# Patient Record
Sex: Female | Born: 1980 | Race: White | Hispanic: Yes | Marital: Single | Smoking: Never smoker
Health system: Southern US, Community
[De-identification: ages and names within clinical notes are randomized; demographics above are authoritative.]

## PROBLEM LIST (undated history)

## (undated) DIAGNOSIS — I10 Essential (primary) hypertension: Secondary | ICD-10-CM

## (undated) HISTORY — PX: OTHER SURGICAL HISTORY: SHX169

## (undated) HISTORY — DX: Essential (primary) hypertension: I10

---

## 2002-08-05 ENCOUNTER — Emergency Department (HOSPITAL_COMMUNITY): Admission: EM | Admit: 2002-08-05 | Discharge: 2002-08-05 | Payer: Self-pay | Admitting: Emergency Medicine

## 2002-08-07 ENCOUNTER — Observation Stay (HOSPITAL_COMMUNITY): Admission: AD | Admit: 2002-08-07 | Discharge: 2002-08-08 | Payer: Self-pay | Admitting: *Deleted

## 2002-08-10 ENCOUNTER — Encounter: Payer: Self-pay | Admitting: Emergency Medicine

## 2002-08-10 ENCOUNTER — Emergency Department (HOSPITAL_COMMUNITY): Admission: EM | Admit: 2002-08-10 | Discharge: 2002-08-10 | Payer: Self-pay | Admitting: Emergency Medicine

## 2002-08-14 ENCOUNTER — Ambulatory Visit (HOSPITAL_COMMUNITY): Admission: RE | Admit: 2002-08-14 | Discharge: 2002-08-14 | Payer: Self-pay | Admitting: *Deleted

## 2003-06-29 ENCOUNTER — Emergency Department (HOSPITAL_COMMUNITY): Admission: EM | Admit: 2003-06-29 | Discharge: 2003-06-30 | Payer: Self-pay | Admitting: Emergency Medicine

## 2003-07-05 ENCOUNTER — Emergency Department (HOSPITAL_COMMUNITY): Admission: EM | Admit: 2003-07-05 | Discharge: 2003-07-06 | Payer: Self-pay | Admitting: Emergency Medicine

## 2003-09-14 ENCOUNTER — Ambulatory Visit (HOSPITAL_COMMUNITY): Admission: RE | Admit: 2003-09-14 | Discharge: 2003-09-14 | Payer: Self-pay | Admitting: *Deleted

## 2003-12-02 ENCOUNTER — Ambulatory Visit (HOSPITAL_COMMUNITY): Admission: RE | Admit: 2003-12-02 | Discharge: 2003-12-02 | Payer: Self-pay | Admitting: *Deleted

## 2004-01-06 ENCOUNTER — Ambulatory Visit (HOSPITAL_COMMUNITY): Admission: AD | Admit: 2004-01-06 | Discharge: 2004-01-07 | Payer: Self-pay | Admitting: *Deleted

## 2004-01-25 ENCOUNTER — Inpatient Hospital Stay (HOSPITAL_COMMUNITY): Admission: AD | Admit: 2004-01-25 | Discharge: 2004-01-28 | Payer: Self-pay | Admitting: *Deleted

## 2004-02-10 ENCOUNTER — Inpatient Hospital Stay (HOSPITAL_COMMUNITY): Admission: AD | Admit: 2004-02-10 | Discharge: 2004-02-13 | Payer: Self-pay | Admitting: Emergency Medicine

## 2007-08-17 ENCOUNTER — Emergency Department (HOSPITAL_COMMUNITY): Admission: EM | Admit: 2007-08-17 | Discharge: 2007-08-17 | Payer: Self-pay | Admitting: Emergency Medicine

## 2009-12-19 ENCOUNTER — Ambulatory Visit (HOSPITAL_COMMUNITY): Admission: RE | Admit: 2009-12-19 | Discharge: 2009-12-19 | Payer: Self-pay | Admitting: Family Medicine

## 2010-08-02 ENCOUNTER — Emergency Department (HOSPITAL_COMMUNITY)
Admission: EM | Admit: 2010-08-02 | Discharge: 2010-08-03 | Disposition: A | Discharge status: Home / Self Care | Payer: Self-pay | Attending: Emergency Medicine | Admitting: Emergency Medicine

## 2010-08-02 ENCOUNTER — Emergency Department (HOSPITAL_COMMUNITY): Payer: Self-pay

## 2010-08-02 DIAGNOSIS — R0602 Shortness of breath: Secondary | ICD-10-CM | POA: Insufficient documentation

## 2010-08-02 DIAGNOSIS — K219 Gastro-esophageal reflux disease without esophagitis: Secondary | ICD-10-CM | POA: Insufficient documentation

## 2010-08-02 DIAGNOSIS — R071 Chest pain on breathing: Secondary | ICD-10-CM | POA: Insufficient documentation

## 2010-08-02 DIAGNOSIS — R0609 Other forms of dyspnea: Secondary | ICD-10-CM | POA: Insufficient documentation

## 2010-08-02 DIAGNOSIS — R0989 Other specified symptoms and signs involving the circulatory and respiratory systems: Secondary | ICD-10-CM | POA: Insufficient documentation

## 2010-08-02 LAB — URINALYSIS, ROUTINE W REFLEX MICROSCOPIC
Hgb urine dipstick: NEGATIVE
Nitrite: NEGATIVE
Protein, ur: NEGATIVE mg/dL
Specific Gravity, Urine: 1.01 (ref 1.005–1.030)
Urobilinogen, UA: 0.2 mg/dL (ref 0.0–1.0)

## 2010-08-02 LAB — DIFFERENTIAL
Eosinophils Relative: 1 % (ref 0–5)
Lymphocytes Relative: 38 % (ref 12–46)
Monocytes Absolute: 0.5 10*3/uL (ref 0.1–1.0)
Monocytes Relative: 7 % (ref 3–12)
Neutro Abs: 4 10*3/uL (ref 1.7–7.7)

## 2010-08-02 LAB — POCT CARDIAC MARKERS: Myoglobin, poc: 76 ng/mL (ref 12–200)

## 2010-08-02 LAB — BASIC METABOLIC PANEL
CO2: 25 mEq/L (ref 19–32)
Calcium: 9.6 mg/dL (ref 8.4–10.5)
Chloride: 104 mEq/L (ref 96–112)
GFR calc Af Amer: >60 mL/min (ref >60)
Glucose, Bld: 109 mg/dL — ABNORMAL HIGH (ref 70–99)
Sodium: 135 mEq/L (ref 135–145)

## 2010-08-02 LAB — CBC
HCT: 41.5 % (ref 36.0–46.0)
Hemoglobin: 15 g/dL (ref 12.0–15.0)
MCH: 30.9 pg (ref 26.0–34.0)
MCHC: 36.1 g/dL — ABNORMAL HIGH (ref 30.0–36.0)
MCV: 85.4 fL (ref 78.0–100.0)
RDW: 11.7 % (ref 11.5–15.5)

## 2010-08-03 LAB — D-DIMER, QUANTITATIVE

## 2010-10-13 NOTE — H&P (Signed)
NAME:  Tina Shepard, Tina [name]:  [id number]   MEDICAL RECORD NO.:  [id number]                   PATIENT TYPE:   LOCATION:  LDR1                                 FACILITY:   PHYSICIAN:  Langley Gauss, M.D.                DATE OF BIRTH:   DATE OF ADMISSION:  01/25/2004  DATE OF DISCHARGE:                                HISTORY & PHYSICAL   HISTORY OF PRESENT ILLNESS:  The patient is a 30 year old gravida 3, para 2  at 38-1/[redacted] weeks gestation who is admitted for medically-indicated induction  of labor. The patient is noted to have a history of very-rapid labor and  delivery with two previous.  In addition, she is noted to have a very short  second stage with only three pushes with previous delivery.  The patient's  prenatal course has been uncomplicated.  She is noted to have glucose  tolerance test normal at 80, O-positive blood type.  She did have some first  trimester bleeding and was seen in the emergency room on two occasions at  which time fetal viability had been confirmed.  She has had serial  ultrasound which have documented normal anatomic surgery and adequate fetal  growth.  She is GBS carrier status negative, tested January 05, 2004.   PAST MEDICAL HISTORY:  No known drug allergies.   CURRENT MEDICATIONS:  Prenatal vitamins only.   PAST OB HISTORY:  December of 1998, vaginal delivery of a 5-pound infant,  and April of 2000, vaginal delivery of a 6-pound infant.  On both of these  occasions, she had declined placement of epidural and had received IV pain  medication only.  As stated previously, she was noted to have very-short  second stages of labor.   SOCIAL HISTORY:  The patient is a nonsmoker.  Family expected to be very  involved with the labor process at labor and delivery.  The patient does  plan on bottle feeding.   She would like to utilize an IUD for postpartum birth control purposes.  She  had previously used a NuvaRing.   PHYSICAL  EXAMINATION:  VITAL SIGNS:  She is 5 feet, 1 inch.  Pre-pregnancy  weight 117.  Today's weight 155.  Blood pressure 126/78.  Pulse rate 80.  Respiratory rate 20.  HEENT:  Negative.  No adenopathy.  NECK:  Supple.  Thyroid is nonpalpable.  LUNGS:  Clear.  CARDIOVASCULAR:  Regular rate and rhythm.  ABDOMEN:  Soft, nontender.  No surgical scars are identified.  She is vertex  presentation by Thayer Ohm maneuver.  Fundal height is 34 cm.  Fetal heart  tones auscultated in the 150's.  PELVIC:  Pelvic exam, normal external genitalia.  No lesions or ulcerations  identified.  Difficult digital examination.  The patient tenses up.  She,  however, is noted to have vertex at 0 station.  Posterior position of the  cervix, dilated about 1 cm, very soft.  ASSESSMENT:  Term intrauterine pregnancy, with very rapid labor and delivery  previously.  Due to the slightly unfavorable nature of the cervix, the  patient will be referred in the a.m. for placement of _________ for  mechanical ripening of cervix prior to amniotomy.     ___________________________________________                                         Langley Gauss, M.D.   DC/MEDQ  D:  01/19/2004  T:  01/19/2004  Job:  [id number]

## 2010-10-13 NOTE — Discharge Summary (Signed)
   [name], BATCH                       ACCOUNT NO.:  [id number]   MEDICAL RECORD NO.:  [id number]                   PATIENT TYPE:  OBV   LOCATION:  A419                                 FACILITY:  [facility]   PHYSICIAN:  Langley Gauss, M.D.                DATE OF BIRTH:  26-Oct-1980   DATE OF ADMISSION:  08/07/2002  DATE OF DISCHARGE:  08/08/2002                                 DISCHARGE SUMMARY   DISCHARGE DIAGNOSES:  1. Endometritis.  2. Nausea and vomiting secondary to No. 1.  3. Status post removal of intrauterine device 4 days previously.   DISPOSITION:  At time of discharge, the patient is tolerating regular  general diet.  She will follow up in the office in one week's time.   LABORATORY DATA:  HCG is negative.  Hemoglobin 11.2, hematocrit 33.0 with a  white count of 6.1.  Electrolytes within normal limits.  Liver function  tests within normal limits.   DISCHARGE MEDICATIONS:  The patient has taken her Ortho-Evra patch off  during this hospitalization, but she will reinitiate it use for birth  control purposes.  She did receive Rocephin 1 gm IV q.24 hours during this  hospitalization, receiving 2 dosages.   HOSPITAL COURSE:  The patient presented to the office on August 07, 2002,  complaining of significant nausea and vomiting, stating she has been unable  to keep anything down, with resultant dehydration.  Upon examination, she  was noted to have somewhat of a purulent-looking discharge from the cervical  os and manual examination revealed a normal size uterus with no adnexal  masses, but the uterus was noted to be acutely tender to deep manipulation.  Thus diagnosis was that of endometritis, status post IUD removal.  The  patient was referred to [facility] at which time she was treated  with IV fluids.  In addition, IV antibiotics were initiated and continued  x24 hours.  The patient did well with the rehydration therapy such that she  was able to tolerated  regular general diet, markedly improved.  She did  remain afebrile during the hospital course, with stable vital signs.  Discharged to home thus on August 08, 2002, and follow up in one week's time.  Hopefully, the patient will do well with the Lucienne Minks for birth control  purposes; however if she continues to have nausea and headache, which could  possibly be due to the [facility], the patient has expressed an interest in  Nuva ring intravaginal insert.   SUMMARY:                                               Langley Gauss, M.D.    DC/MEDQ  D:  08/10/2002  T:  08/10/2002  Job:  [id number]

## 2010-10-13 NOTE — Group Therapy Note (Signed)
[name], [name] NO.:  [id number]   MEDICAL RECORD NO.:  [id number]                   PATIENT TYPE:   LOCATION:                                       FACILITY:   PHYSICIAN:  Langley Gauss, M.D.                DATE OF BIRTH:   DATE OF PROCEDURE:  01/26/2004  DATE OF DISCHARGE:                                   PROGRESS NOTE   The patient had the Foley bulb placed the p.m. of 01/25/2004 for mechanical  ripening of the cervix. Throughout the evening she slept very comfortably  after receiving the p.o. Ambien.  I initially saw the patient the a.m. of  01/26/2004 at 0600; however, she was up ambulating to the bathroom at that  time; and, thus she was not revaluated again until 0800.  At that point in  time the Foley bulb catheter is still noted to be in place.  The patient  continues to having a reassuring fetal heart rate.  Gentle traction is  applied on the Foley bulb catheter.  It was necessary to remove 10 cc of  sterile normal saline from the Foley bulb which then allows removal of the  Foley catheter.   Reexamination of the cervix initially post removal reveals the cervix, now  to be, 6 cm dilated, -1 station, 70% effaced, vertex is well applied to the  cervix.  Fetal scalp electrode is then placed with resultant amniotomy and  findings of clear amniotic fluid.  The patient is to be managed expectantly,  at this time, times 1-2 hour duration.  If labor does not spontaneously  ensue the patient will receive Pitocin induction of labor.  She is noted to  have a history of very rapid labor with a short second stage.  At previous  labor and deliveries the patient has received IV narcotics only and  presumably this will be her plan for the course of this labor and delivery.  Currently the fetal scalp electrode does document reassuring fetal heart  rate.      ___________________________________________  Langley Gauss, M.D.   DC/MEDQ  D:  01/27/2004  T:  01/27/2004  Job:  [id number]

## 2010-10-13 NOTE — Discharge Summary (Signed)
Tina Shepard, Tina Shepard                       ACCOUNT NO.:  [id number]   MEDICAL RECORD NO.:  [id number]                   PATIENT TYPE:  INP   LOCATION:  A427                                 FACILITY:  [facility]   PHYSICIAN:  Langley Gauss, M.D.                DATE OF BIRTH:  Nov 14, 1980   DATE OF ADMISSION:  02/10/2004  DATE OF DISCHARGE:  02/13/2004                                 DISCHARGE SUMMARY   DISCHARGE DIAGNOSES:  Two weeks postpartum with acute onset of  pyelonephritis, which is unrelated to the previous pregnancy.   DISPOSITION:  At time of discharge, patient is given instructions to  followup in the office in 1 weeks time.   DISCHARGE MEDICATIONS:  Keflex 500 mg p.o. q.i.d. x7 days. During the  hospitalization the patient was treated with Rocephin 1 gram intravenously  q. 12 hours. Initially also Doxycycline 100 mg intravenously q. 12 hours.  When it became clear that the diagnosis was pyelonephritis, the patient was  discontinued of the Doxycycline and started on Cleocin 600 mg intravenously  q. 8 hours.   SPECIAL INSTRUCTIONS:  The patient is given instructions to call the  hospital for temperature greater than or equal to 101. In addition,  inability to tolerate p.o. antibiotics would also be an indication.   LABORATORY DATA:  The urinalysis upon admission revealed moderate  hemoglobin, moderate leukocyte esterase, positive nitrites, positive for  ketones, few epithelial cells, many bacteria, 21 to 50 white cells, 7 to 10  red cells all consistent with urinary tract infection. The urine culture was  positive for 10,000 colonies per milliliter. Blood cultures negative.  Initial white count 20.8, greater than 20% bands hospitalization day  #2. The white blood count diminished to 12.2. Hemoglobin 8.7. Hematocrit  25.4.   HOSPITAL COURSE:  The patient presented to the office on February 10, 2004  complaining of postpartum fever. She was referred directly to [facility] at which time she was made a direct admission. Initial differential  diagnoses included postpartum endometritis as diagnosis exclusion,  pyelonephritis, appendicitis, tubo-ovarian abscess. The patient was started  empirically on intravenous antibiotics. When the urinalysis was received, it  became apparent that the patient was experiencing fever, malaise, and  myalgias in association with pyelonephritis. The patient's initial  temperature in the office was 104.9. Upon referral to [facility]  she was 103.1. She continued to have spiking fevers during hospitalization  day #1 with temperature of 103.1, 103.1. There was a gradual improvement in  the temperature curve on February 12, 2004, hospitalization day #2. The  patient was noted to have a temperature maximum of 102.3. Now on February 13, 2004, the patient has had a temperature maximum the a.m. of 100.3. She  is tolerating p.o. intake very well. She has no significant complaints of  flank pain, thus she  should do very well on p.o. antibiotics. Currently during this  hospitalization, she discontinued breast feeding when she was treated with  the Doxycycline intravenously. With the Keflex as her only discharge  antibiotic, she can now resume breast feeding if she would so desire. Thus,  date of discharge is now February 13, 2004.      DC/MEDQ  D:  02/13/2004  T:  02/13/2004  Job:  [id number]

## 2010-10-13 NOTE — Op Note (Signed)
Tina Shepard, Tina Shepard                       ACCOUNT NO.:  [id number]   MEDICAL RECORD NO.:  [id number]                   PATIENT TYPE:  INP   LOCATION:  LDR1                                 FACILITY:  [facility]   PHYSICIAN:  Langley Gauss, M.D.                DATE OF BIRTH:  05-Mar-1981   DATE OF PROCEDURE:  01/25/2004  DATE OF DISCHARGE:                                 OPERATIVE REPORT   PROCEDURE:  1.  Placement of Foley bulb for mechanical ripening of the cervix prior to      amniotomy and induction.  2.  Nonstress interpretation.   PROCEDURE PERFORMED BY:  Langley Gauss, M.D.   COMPLICATIONS:  None.   SPECIMENS:  None.   SUMMARY:  The patient is admitted for induction of labor due to the posterior  unfavorable nature of the cervix. Plan was to place Foley bulb for  mechanical ripening of the cervix. Per the protocol, the patient was placed  on the external fetal monitor for fetal monitoring. Nonstress interpretation  reveals fetal heart rate baseline of 150, accelerations greater than 15  beats per minute for greater than 15 seconds duration. Normal long term  variability and no fetal heart decelerations are noted. External toco  reveals irregular uterine contractions every 6 minutes, mild in nature and  not perceived by the patient. The patient is placed in a dorsal lithotomy  position. Sterile speculum examination is performed. Immediately prior  patient's exam cervical noted to be 1 cm dilated and 60% effaced, vertex at  a 0 station, butt far posterior. Speculum is difficult secondary to  multiparous nature of this patient and converging side walls. In addition,  she is very poor tolerant of the exam. A 16 soft French Foley bulb catheter  is utilized. On the initial attempt, Foley appeared to be inserted properly;  however, it was then inflated. Gentle traction resulted in withdraw of the  Foley catheter. Thus, second attempt is performed. The speculum is  redirected  farther posterior. On the second attempt again, sponge stick to  used to grasp the 16-French Foley bulb catheter. It is apparently passed  intracervically. Second attempt, it is reinflated. Gentle traction again  results in removal. Thus, procedure performed is placement of Foley bulb for  mechanical ripening of the cervix. This was unsuccessful. Thus, attempts are  abandoned. The patient is to be started on Pitocin induction at this time.  The cervix coming anteriorly, we will be able to proceed with amniotomy.      ___________________________________________                                            Langley Gauss, M.D.   DC/MEDQ  D:  01/25/2004  T:  01/25/2004  Job:  [id number]

## 2010-10-13 NOTE — H&P (Signed)
NAMENAFISAH, RUNIONS                       ACCOUNT NO.:  [id number]   MEDICAL RECORD NO.:  [id number]                   PATIENT TYPE:  INP   LOCATION:  A427                                 FACILITY:  [facility]   PHYSICIAN:  Langley Gauss, M.D.                DATE OF BIRTH:  [birth year]   DATE OF ADMISSION:  02/10/2004  DATE OF DISCHARGE:                                HISTORY & PHYSICAL   HISTORY OF PRESENT ILLNESS:  The patient is a 30 year old who is 2 weeks  postpartum, who presented to the office today complaining of fever, chills  and myalgia, as well as some back pain x24 hours' duration.  She was noted  in the office to have a temperature of 104.9, so she was referred  immediately to [facility].  The patient also complained  of some nausea and vomiting, but had excellent p.o. intake during the  initial hospital evaluation.   PAST MEDICAL HISTORY:  Patient's past medical history is pertinent for  previous vaginal deliveries without complications, most recently 2 weeks  previously.  She received only IV pain medication during the course of labor  and was discharged home on postpartum day #1-1/2 with no postpartum  complications.   ALLERGIES:  She states she has no known drug allergies.   CURRENT MEDICATIONS:  None.   REVIEW OF SYSTEMS:  Review of systems is pertinent for no prior problems  with any cystitis, no prior kidney infections, no prior kidney stones.   PHYSICAL EXAMINATION:  VITAL SIGNS:  Temperature 103.1, heart rate 114,  respiratory rate is 20, blood pressure is 92/50.  ABDOMEN:  Abdomen is soft and nontender.  No rebound, guarding, pelvic or  abdominal masses are appreciated.  EXTREMITIES:  Normal.  PELVIC EXAMINATION:  Uterus 6 weeks' size, nontender.  No abnormal discharge  is identified, no excessive vaginal bleeding.  There is moderate right flank  tenderness.   LABORATORY STUDIES:  Laboratory studies reveal clean-catch urinalysis with  a  few epithelial cells, 21-50 white cells, 7-10 red cells, many bacteria,  positive leukocyte esterase, positive nitrites as well as positive  hemoglobin.  The white count is elevated at 20.8 with greater than 20%  bands.   ASSESSMENT:  Two weeks postpartum with pyelonephritis, patient treated at  this time with intravenous fluids for rehydration therapy; in addition, she  will be treated with Rocephin 1 g intravenously q.12 h.  With the high white  count and the degree of fever, patient admitted at this time for described  therapy.  She has good appetite, good oral intake, thus appendicitis is  essentially ruled out.    ___________________________________________                                         Langley Gauss, M.D.   DC/MEDQ  D:  02/11/2004  T:  02/11/2004  Job:  [id number]

## 2010-10-13 NOTE — H&P (Signed)
NAMEMARIEL, Tina Shepard                       ACCOUNT NO.:  [id number]   MEDICAL RECORD NO.:  [id number]                   PATIENT TYPE:  OBV   LOCATION:  A419                                 FACILITY:  [facility]   PHYSICIAN:  Langley Gauss, M.D.                DATE OF BIRTH:  1980-11-05   DATE OF ADMISSION:  08/07/2002  DATE OF DISCHARGE:                                HISTORY & PHYSICAL   HISTORY OF PRESENT ILLNESS:  The patient is an 30 year old who comes in the  office today with the chief complaint of headache and stomach cramping.  The  patient's recent history is pertinent in that she was seen in the office  four days previously at which time she was noted to have had the onset of  pelvic cramping and nearly expelled her IUD.  The IUD was removed on that  date.  The patient subsequently had significant relief from the cramping  following IUD removal and she started birth control patch that p.m.  Subsequently the patient states that she has developed bifrontal headache  and significant nausea, worse two days prior than today.  She states she  vomited four times yesterday but has not vomited today although has had a  poor appetite.  She has had very minimal p.o. intake.  She has left work  early and went to [employer] two days previously.  However,  after waiting seven hours without being seen she left.   ALLERGIES:  No known drug allergies.   PAST MEDICAL HISTORY:  1. Two prior spontaneous vaginal deliveries without difficulties.  2. She has never taken birth control pills before and has only utilized the     IUD for birth control purposes, which has now been removed.   PHYSICAL EXAMINATION:  GENERAL: She appears to be mildly distressed,  complaining of a bifrontal headache.  ABDOMEN: Soft and nontender.  PELVIC: Reveals normal external genitalia.  The cervix is noted to be  without lesions but there appears to be a mucopurulent discharge with one  small clot  present.  Bimanual examination reveals anteverted normal sized  uterus which is mildly tender to manipulation.  The ovaries are palpable  normal but also mildly tender.   LABORATORY DATA:  A wet prep was performed which reveals increased WBCs.  No  other abnormalities identified.  Recent GC and Chlamydia cultures done four  days previously have subsequently returned negative.   ASSESSMENT:  The patient is status post removal of IUD with presumed  diagnosis at this time of endometritis.   PLAN:  The patient is thus referred to [facility] on an observation  status.  She will be treated with IV fluids and empiric antibiotics of  Rocephin 1 g IV q.24h.  I have advised her to leave the birth control patch  in place other than attributing all of her current symptoms to the oral  contraceptive  patch.                                               Langley Gauss, M.D.   DC/MEDQ  D:  08/07/2002  T:  08/07/2002  Job:  [id number]

## 2010-10-13 NOTE — Discharge Summary (Signed)
NAMEMEADOW, ABRAMO NO.:  [id number]   MEDICAL RECORD NO.:  [id number]                   PATIENT TYPE:   LOCATION:                                       FACILITY:   PHYSICIAN:  Langley Gauss, M.D.                DATE OF BIRTH:   DATE OF ADMISSION:  01/25/2004  DATE OF DISCHARGE:  01/28/2004                                 DISCHARGE SUMMARY   HISTORY:  Date of placement of a Foley bulb:  Initially the Foley bulb was  placed unsuccessfully a.m. of January 25, 2004.  Subsequently, the p.m. of  January 25, 2004 I was able to the successfully place the Foley bulb for  mechanical ripening of the cervix.  The patient progressed to a vaginal  delivery on January 26, 2004, utilizing IV analgesia only.  The patient was  discharged home on January 28, 2004.  She is given a copy of the  standardized discharged instructions.  She does desire IUD for birth control  purposes.  She is advised to follow up in the office in three weeks time, so  that we can evaluate her for the suitability of IUD as well as order the  IUD.  She is both bottle and breast feeding at the time of discharge.   ADDITIONAL DIAGNOSIS:  Anemia secondary to blood loss.   DISCHARGE MEDICATIONS:  1.  Hemocyte-F 1 p.o. daily #30 with no refill.  2.  In addition she will continue with her prenatal vitamins.   LABORATORY DATA:  She is known to be O positive blood type.  RPR is  nonreactive.  Hemoglobin 12.6, hematocrit 35.5 and 8.1 upon admission.  On  post partum day #1 hemoglobin was 9.5, hematocrit 26.7 with a white count of  12.5.  On January 28, 2004 her date of discharge, her hemoglobin was 8.6,  hematocrit 23.8, with a white count of 9.4.   HOSPITAL COURSE:  See previous dictations.  The patient was admitted the  a.m. of January 25, 2004 with a planned Foley bulb induction of labor.  However, initially in the a.m. of January 25, 2004, placement of the Foley  bulb was unsuccessful.  Thus  the patient was treated with IV induction  during the day on January 25, 2004.  However, she failed to achieve any  significant cervical change.  There was some decent of the vertex and the  cervix was noted to come somewhat anteriorly.  This effectively allowed me  to place a Foley bulb for mechanical ripening of the cervix the p.m. of  January 25, 2004.  Procedure performed on January 25, 2004 includes a  nonstress test interpretation.  The patient is on an external fetal monitor.  She was noted to have a reactive nonstress test.  She was placed in the  dorsal lithotomy position.  A 16 French Foley catheter was placed through  the endocervical os.  Inflated volume was  60 cc of sterile normal saline.  I  was able to successfully place it on the second attempt, primarily because I  kept pressure on the os speculum while it was in the vaginal area.  This  prevented collapse of the vaginal side walls.  The patient tolerated this  very well.  The Foley catheter was left in place the entire p.m. of January 25, 2004 and was removed the a.m. of January 26, 2004, at which time on  examination the patient was noted to be about 5 cm dilated and vertex  presentation was confirmed by ultrasound.  On the a.m. of August 32, 2005 I  removed the Foley bulb, I examined the patient with an attempted amniotomy.  I was unable to proceed with the amniotomy, as the presenting part was very  high and non-engaged.  Thus a limited OB ultrasound was performed and  interpreted by Dr. Roylene Reason. Lisette Grinder on the a.m. of January 26, 2004.  This  did reveal the presence of anterior fundal placenta.  No low lying placenta.  Vertex presentation was noted on the transabdominal scan.  The patient,  however, was noted to have a very full bladder.  Thus the patient was  encouraged to empty her bladder, though she did not have any urge to pee,  she obviously had a very full bladder.  She was then reexamined after  emptying the  bladder.  It was obviously vertex presentation.  I was able to  proceed with the amniotomy.  Subsequently the patient did well throughout  the daytime and delivered the a.m. of January 26, 2004.  The patient did well  postpartum with no postpartum complications.  She was ambulatory and  remained afebrile.  She has had no heavy bleeding postpartum.  She tolerated  the relative anemia very well.  Thus, without any problems, she was  discharged to home on January 28, 2004.     ___________________________________________                                         Langley Gauss, M.D.   DC/MEDQ  D:  01/31/2004  T:  01/31/2004  Job:  [id number]

## 2010-10-13 NOTE — Op Note (Signed)
NAMECHANDLAR, [name]:  [id number]   MEDICAL RECORD NO.:  [id number]                   PATIENT TYPE:   LOCATION:                                       FACILITY:   PHYSICIAN:  Langley Gauss, M.D.                DATE OF BIRTH:   DATE OF PROCEDURE:  01/25/2004  DATE OF DISCHARGE:                                 OPERATIVE REPORT   PROCEDURE:  Placement of Foley bulb for mechanical ripening of the cervix  performed by Dr. Roylene Reason. Lisette Grinder.   SUMMARY:  Of note, early this a.m. placement of a Foley bulb had been  unsuccessful, thus the patient at that time was started on Pitocin  induction.  She has increased to a value of 12 milliunits per minute with  contraction occurring q.3-5 minutes but of very mild intensity.  Discussed  with the patient the late p.m. of 01/25/2004 reattempt of placement of the  Foley bulb for mechanical ripening. Patient agrees, thus, after interpreting  a nonstress test on 01/15/2004 as being reactive, the patient is placed in  the dorsal lithotomy position.  Sterile speculum examination is performed to  visualize the cervix. A 16 French Foley catheter is then inserted and passed  through the endocervical os into the lower uterine segment.  This became  much easier on this __________  placement of the speculum due to the  patient's intense pelvic musculature the cervix was not visualized as the  speculum was being somewhat dislodged and pushed outwardly from the vagina.  Thus in the p.m. of 01/25/2004 I kept continuous pressure on the speculum  after the cervix was visualized.  The 16 French Foley catheter was easily  inserted through the endocervical os and inflated to a volume of 60 cc with  sterile normal saline and left in place.  The patient is, again, placed on  the external fetal monitor for fetal heart rate interpretation.  She will be  treated with IV Buprenex and Phenergan as needed for pain relief.  In  addition 10  mg of p.o. Ambien is requested q.h.s. on a p.r.n. basis.      ___________________________________________                                            Langley Gauss, M.D.   DC/MEDQ  D:  01/27/2004  T:  01/27/2004  Job:  [id number]

## 2010-10-13 NOTE — Op Note (Signed)
Tina Shepard, Tina Shepard                       ACCOUNT NO.:  [id number]   MEDICAL RECORD NO.:  [id number]                   PATIENT TYPE:  INP   LOCATION:  LDR1                                 FACILITY:  [facility]   PHYSICIAN:  Langley Gauss, M.D.                DATE OF BIRTH:  1980/06/16   DATE OF PROCEDURE:  01/25/2004  DATE OF DISCHARGE:                                 OPERATIVE REPORT   PROCEDURE PERFORMED:  Placement of Foley bulb for mechanical ripening of the  cervix prior to amniotomy.   PROCEDURE PERFORMED BY:  Langley Gauss, M.D.   COMPLICATIONS:  None.   SUMMARY:  ________________ attempted to place a Foley bulb catheter. Thus,  patient subsequently was started on Pitocin induction. She did achieve  infusion rate of 12   Dictation ended at this point.      ___________________________________________                                            Langley Gauss, M.D.   DC/MEDQ  D:  01/26/2004  T:  01/26/2004  Job:  [id number]

## 2011-02-19 LAB — DIFFERENTIAL
Basophils Absolute: 0
Basophils Relative: 0
Eosinophils Absolute: 0
Monocytes Relative: 9
Neutrophils Relative %: 84 — ABNORMAL HIGH

## 2011-02-19 LAB — BASIC METABOLIC PANEL
BUN: 9
CO2: 28
Calcium: 8.8
Chloride: 95 — ABNORMAL LOW
Creatinine, Ser: 0.72
Glucose, Bld: 138 — ABNORMAL HIGH

## 2011-02-19 LAB — CBC
MCHC: 35.4
MCV: 89.3
Platelets: 149 — ABNORMAL LOW
RBC: 4.66
RDW: 12.3

## 2013-06-22 ENCOUNTER — Emergency Department (HOSPITAL_COMMUNITY): Payer: No Typology Code available for payment source

## 2013-06-22 ENCOUNTER — Emergency Department (HOSPITAL_COMMUNITY)
Admission: EM | Admit: 2013-06-22 | Discharge: 2013-06-22 | Disposition: A | Discharge status: Home / Self Care | Payer: Self-pay | Attending: Emergency Medicine | Admitting: Emergency Medicine

## 2013-06-22 ENCOUNTER — Encounter (HOSPITAL_COMMUNITY): Payer: Self-pay | Admitting: Emergency Medicine

## 2013-06-22 DIAGNOSIS — Y9389 Activity, other specified: Secondary | ICD-10-CM | POA: Insufficient documentation

## 2013-06-22 DIAGNOSIS — S0990XA Unspecified injury of head, initial encounter: Secondary | ICD-10-CM | POA: Insufficient documentation

## 2013-06-22 DIAGNOSIS — Y92410 Unspecified street and highway as the place of occurrence of the external cause: Secondary | ICD-10-CM | POA: Insufficient documentation

## 2013-06-22 DIAGNOSIS — S139XXA Sprain of joints and ligaments of unspecified parts of neck, initial encounter: Secondary | ICD-10-CM | POA: Insufficient documentation

## 2013-06-22 MED ORDER — MORPHINE SULFATE 4 MG/ML IJ SOLN
4.0000 mg | Freq: Once | INTRAMUSCULAR | Status: AC
  Administered 2013-06-22: 4 mg via INTRAVENOUS
  Filled 2013-06-22: qty 1
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
## 2020-05-28 NOTE — L&D Delivery Note (Signed)
Delivery Note After a 2 hour 2nd stage, t 6:42 AM a viable female was delivered via Vaginal, Spontaneous (Presentation: Right Occiput Anterior).  APGAR: 8, 9; weight  .   Placenta status: Spontaneous, Intact.  After 1 minute, the cord was clamped and cut. 40 units of pitocin diluted in 1000cc LR was infused rapidly IV.  The placenta separated spontaneously and delivered via CCT and maternal pushing effort.  It was inspected and appears to be intact with a 3 VC. Her uterus was slow to firm, so cytotec given PR.  After bleeding normalized, and IUD was placed (see separate note).  Dr. Jolayne Panther in room for delivery d/t prolonged 2nd stage  Anesthesia: Epidural Episiotomy: None Lacerations: None Suture Repair:  Est. Blood Loss (mL): 471  Mom to postpartum.  Baby to Couplet care / Skin to Skin.  Tina Shepard 02/03/2021, 7:27 AM

## 2020-06-06 DIAGNOSIS — Z20822 Contact with and (suspected) exposure to covid-19: Secondary | ICD-10-CM | POA: Diagnosis not present

## 2020-06-06 DIAGNOSIS — F419 Anxiety disorder, unspecified: Secondary | ICD-10-CM | POA: Diagnosis not present

## 2020-06-06 DIAGNOSIS — Z3401 Encounter for supervision of normal first pregnancy, first trimester: Secondary | ICD-10-CM | POA: Diagnosis not present

## 2020-06-20 ENCOUNTER — Encounter: Payer: Self-pay | Admitting: Women's Health

## 2020-06-20 ENCOUNTER — Ambulatory Visit (INDEPENDENT_AMBULATORY_CARE_PROVIDER_SITE_OTHER): Payer: Self-pay | Admitting: Women's Health

## 2020-06-20 ENCOUNTER — Other Ambulatory Visit: Payer: Self-pay

## 2020-06-20 VITALS — BP 127/84 | HR 86 | Ht 60.0 in | Wt 184.0 lb

## 2020-06-20 DIAGNOSIS — Z3201 Encounter for pregnancy test, result positive: Secondary | ICD-10-CM

## 2020-06-20 DIAGNOSIS — Z3491 Encounter for supervision of normal pregnancy, unspecified, first trimester: Secondary | ICD-10-CM

## 2020-06-20 LAB — POCT URINE PREGNANCY: Preg Test, Ur: POSITIVE — AB

## 2020-06-20 MED ORDER — PRENATAL PLUS 27-1 MG PO TABS: 1.0000 | ORAL_TABLET | Freq: Every day | ORAL | 11 refills | Status: AC

## 2020-06-20 NOTE — Progress Notes (Signed)
   GYN VISIT Patient name: Tina Shepard MRN [id number]  Date of birth: [birth year] Chief Complaint:   Possible Pregnancy  History of Present Illness:   Tina Shepard is a 40 y.o. [id number] Hispanic female being seen today for +HPT. Uncertain LMP, maybe in Nov or Dec, but was only 2 days long- usually lasts 7d.  Was on POPs (for h/o HTN) and missed one. Some nausea, declines meds. No pnv yet. CHTN- no meds.  Depression screen [facility] 2/9 06/20/2020  Decreased Interest 0  Down, Depressed, Hopeless 0  PHQ - 2 Score 0  Altered sleeping 0  Tired, decreased energy 0  Change in appetite 0  Feeling bad or failure about yourself  0  Trouble concentrating 0  Moving slowly or fidgety/restless 0  Suicidal thoughts 0  PHQ-9 Score 0    Patient's last menstrual period was 04/10/2020 (exact date). Review of Systems:   Pertinent items are noted in HPI Denies fever/chills, dizziness, headaches, visual disturbances, fatigue, shortness of breath, chest pain, abdominal pain, vomiting, abnormal vaginal discharge/itching/odor/irritation, problems with periods, bowel movements, urination, or intercourse unless otherwise stated above.  Pertinent History Reviewed:  Reviewed past medical,surgical, social, obstetrical and family history.  Reviewed problem list, medications and allergies. Physical Assessment:   Vitals:   06/20/20 1043  BP: 127/84  Pulse: 86  Weight: 184 lb (83.5 kg)  Height: 5' (1.524 m)  Body mass index is 35.94 kg/m.       Physical Examination:   General appearance: alert, well appearing, and in no distress  Mental status: alert, oriented to person, place, and time  Skin: warm & dry   Cardiovascular: normal heart rate noted  Respiratory: normal respiratory effort, no distress  Abdomen: soft, non-tender   Pelvic: examination not indicated  Extremities: no edema   Chaperone: N/A    Results for orders placed or performed in visit on 06/20/20 (from the past 24 hour(s))  POCT  urine pregnancy   Collection Time: 06/20/20 10:50 AM  Result Value Ref Range   Preg Test, Ur Positive (A) Negative    Assessment & Plan:  1) Pregnant> uncertain LMP/GA, will get dating u/s  2) H/O CHTN> no meds  3) Nausea> declines meds  Meds:  Meds ordered this encounter  Medications  . prenatal vitamin w/FE, FA (PRENATAL 1 + 1) 27-1 MG TABS tablet    Sig: Take 1 tablet by mouth daily at 12 noon.    Dispense:  30 tablet    Refill:  11    Order Specific Question:   Supervising Provider    Answer:   Duane Lope H [[id number]]    Orders Placed This Encounter  Procedures  . US OB Comp Less 14 Wks  . POCT urine pregnancy    Return for 1st available, dating u/s.  Cheral Marker CNM, [facility] 06/20/2020 11:18 AM

## 2020-06-20 NOTE — Patient Instructions (Signed)
Tina Shepard, I greatly value your feedback.  If you receive a survey following your visit with [place] today, we appreciate you taking the time to fill it out.  Thanks, [name], CNM, WHNP-BC   [facility] ([address] [place], [place] [address]) Entrance C, located off of [address] Free 24/7 valet parking   Nausea & Vomiting  Have saltine crackers or pretzels by your bed and eat a few bites before you raise your head out of bed in the morning  Eat small frequent meals throughout the day instead of large meals  Drink plenty of fluids throughout the day to stay hydrated, just don't drink a lot of fluids with your meals.  This can make your stomach fill up faster making you feel sick  Do not brush your teeth right after you eat  Products with real ginger are good for nausea, like ginger ale and ginger hard candy Make sure it says made with real ginger!  Sucking on sour candy like lemon heads is also good for nausea  If your prenatal vitamins make you nauseated, take them at night so you will sleep through the nausea  Sea Bands  If you feel like you need medicine for the nausea & vomiting please let us know  If you are unable to keep any fluids or food down please let us know   Constipation  Drink plenty of fluid, preferably water, throughout the day  Eat foods high in fiber such as fruits, vegetables, and grains  Exercise, such as walking, is a good way to keep your bowels regular  Drink warm fluids, especially warm prune juice, or decaf coffee  Eat a 1/2 cup of real oatmeal (not instant), 1/2 cup applesauce, and 1/2-1 cup warm prune juice every day  If needed, you may take Colace (docusate sodium) stool softener once or twice a day to help keep the stool soft.   If you still are having problems with constipation, you may take Miralax once daily as needed to help keep your bowels regular.   Home Blood Pressure Monitoring for Patients    Your provider has recommended that you check your blood pressure (BP) at least once a week at home. If you do not have a blood pressure cuff at home, one will be provided for you. Contact your provider if you have not received your monitor within 1 week.   Helpful Tips for Accurate Home Blood Pressure Checks  . Don't smoke, exercise, or drink caffeine 30 minutes before checking your BP . Use the restroom before checking your BP (a full bladder can raise your pressure) . Relax in a comfortable upright chair . Feet on the ground . Left arm resting comfortably on a flat surface at the level of your heart . Legs uncrossed . Back supported . Sit quietly and don't talk . Place the cuff on your bare arm . Adjust snuggly, so that only two fingertips can fit between your skin and the top of the cuff . Check 2 readings separated by at least one minute . Keep a log of your BP readings . For a visual, please reference this diagram: http://ccnc.care/bpdiagram  Provider Name: [facility]     Phone: [phone]  Zone 1: ALL CLEAR  Continue to monitor your symptoms:  . BP reading is less than 140 (top number) or less than 90 (bottom number)  . No right upper stomach pain . No headaches or  seeing spots . No feeling nauseated or throwing up . No swelling in face and hands  Zone 2: CAUTION Call your doctor's office for any of the following:  . BP reading is greater than 140 (top number) or greater than 90 (bottom number)  . Stomach pain under your ribs in the middle or right side . Headaches or seeing spots . Feeling nauseated or throwing up . Swelling in face and hands  Zone 3: EMERGENCY  Seek immediate medical care if you have any of the following:  . BP reading is greater than160 (top number) or greater than 110 (bottom number) . Severe headaches not improving with Tylenol . Serious difficulty catching your breath . Any worsening symptoms from Zone 2    First Trimester of  Pregnancy The first trimester of pregnancy is from week 1 until the end of week 12 (months 1 through 3). A week after a sperm fertilizes an egg, the egg will implant on the wall of the uterus. This embryo will begin to develop into a baby. Genes from you and your partner are forming the baby. The female genes determine whether the baby is a boy or a girl. At 6-8 weeks, the eyes and face are formed, and the heartbeat can be seen on ultrasound. At the end of 12 weeks, all the baby's organs are formed.  Now that you are pregnant, you will want to do everything you can to have a healthy baby. Two of the most important things are to get good prenatal care and to follow your health care provider's instructions. Prenatal care is all the medical care you receive before the baby's birth. This care will help prevent, find, and treat any problems during the pregnancy and childbirth. BODY CHANGES Your body goes through many changes during pregnancy. The changes vary from woman to woman.   You may gain or lose a couple of pounds at first.  You may feel sick to your stomach (nauseous) and throw up (vomit). If the vomiting is uncontrollable, call your health care provider.  You may tire easily.  You may develop headaches that can be relieved by medicines approved by your health care provider.  You may urinate more often. Painful urination may mean you have a bladder infection.  You may develop heartburn as a result of your pregnancy.  You may develop constipation because certain hormones are causing the muscles that push waste through your intestines to slow down.  You may develop hemorrhoids or swollen, bulging veins (varicose veins).  Your breasts may begin to grow larger and become tender. Your nipples may stick out more, and the tissue that surrounds them (areola) may become darker.  Your gums may bleed and may be sensitive to brushing and flossing.  Dark spots or blotches (chloasma, mask of pregnancy)  may develop on your face. This will likely fade after the baby is born.  Your menstrual periods will stop.  You may have a loss of appetite.  You may develop cravings for certain kinds of food.  You may have changes in your emotions from day to day, such as being excited to be pregnant or being concerned that something may go wrong with the pregnancy and baby.  You may have more vivid and strange dreams.  You may have changes in your hair. These can include thickening of your hair, rapid growth, and changes in texture. Some women also have hair loss during or after pregnancy, or hair that feels dry or thin. Your  hair will most likely return to normal after your baby is born. WHAT TO EXPECT AT YOUR PRENATAL VISITS During a routine prenatal visit:  You will be weighed to make sure you and the baby are growing normally.  Your blood pressure will be taken.  Your abdomen will be measured to track your baby's growth.  The fetal heartbeat will be listened to starting around week 10 or 12 of your pregnancy.  Test results from any previous visits will be discussed. Your health care provider may ask you:  How you are feeling.  If you are feeling the baby move.  If you have had any abnormal symptoms, such as leaking fluid, bleeding, severe headaches, or abdominal cramping.  If you have any questions. Other tests that may be performed during your first trimester include:  Blood tests to find your blood type and to check for the presence of any previous infections. They will also be used to check for low iron levels (anemia) and Rh antibodies. Later in the pregnancy, blood tests for diabetes will be done along with other tests if problems develop.  Urine tests to check for infections, diabetes, or protein in the urine.  An ultrasound to confirm the proper growth and development of the baby.  An amniocentesis to check for possible genetic problems.  Fetal screens for spina bifida and  Down syndrome.  You may need other tests to make sure you and the baby are doing well. HOME CARE INSTRUCTIONS  Medicines  Follow your health care provider's instructions regarding medicine use. Specific medicines may be either safe or unsafe to take during pregnancy.  Take your prenatal vitamins as directed.  If you develop constipation, try taking a stool softener if your health care provider approves. Diet  Eat regular, well-balanced meals. Choose a variety of foods, such as meat or vegetable-based protein, fish, milk and low-fat dairy products, vegetables, fruits, and whole grain breads and cereals. Your health care provider will help you determine the amount of weight gain that is right for you.  Avoid raw meat and uncooked cheese. These carry germs that can cause birth defects in the baby.  Eating four or five small meals rather than three large meals a day may help relieve nausea and vomiting. If you start to feel nauseous, eating a few soda crackers can be helpful. Drinking liquids between meals instead of during meals also seems to help nausea and vomiting.  If you develop constipation, eat more high-fiber foods, such as fresh vegetables or fruit and whole grains. Drink enough fluids to keep your urine clear or pale yellow. Activity and Exercise  Exercise only as directed by your health care provider. Exercising will help you:  Control your weight.  Stay in shape.  Be prepared for labor and delivery.  Experiencing pain or cramping in the lower abdomen or low back is a good sign that you should stop exercising. Check with your health care provider before continuing normal exercises.  Try to avoid standing for long periods of time. Move your legs often if you must stand in one place for a long time.  Avoid heavy lifting.  Wear low-heeled shoes, and practice good posture.  You may continue to have sex unless your health care provider directs you otherwise. Relief of Pain  or Discomfort  Wear a good support bra for breast tenderness.    Take warm sitz baths to soothe any pain or discomfort caused by hemorrhoids. Use hemorrhoid cream if your health care provider  approves.    Rest with your legs elevated if you have leg cramps or low back pain.  If you develop varicose veins in your legs, wear support hose. Elevate your feet for 15 minutes, 3-4 times a day. Limit salt in your diet. Prenatal Care  Schedule your prenatal visits by the twelfth week of pregnancy. They are usually scheduled monthly at first, then more often in the last 2 months before delivery.  Write down your questions. Take them to your prenatal visits.  Keep all your prenatal visits as directed by your health care provider. Safety  Wear your seat belt at all times when driving.  Make a list of emergency phone numbers, including numbers for family, friends, the hospital, and police and fire departments. General Tips  Ask your health care provider for a referral to a local prenatal education class. Begin classes no later than at the beginning of month 6 of your pregnancy.  Ask for help if you have counseling or nutritional needs during pregnancy. Your health care provider can offer advice or refer you to specialists for help with various needs.  Do not use hot tubs, steam rooms, or saunas.  Do not douche or use tampons or scented sanitary pads.  Do not cross your legs for long periods of time.  Avoid cat litter boxes and soil used by cats. These carry germs that can cause birth defects in the baby and possibly loss of the fetus by miscarriage or stillbirth.  Avoid all smoking, herbs, alcohol, and medicines not prescribed by your health care provider. Chemicals in these affect the formation and growth of the baby.  Schedule a dentist appointment. At home, brush your teeth with a soft toothbrush and be gentle when you floss. SEEK MEDICAL CARE IF:   You have dizziness.  You have mild  pelvic cramps, pelvic pressure, or nagging pain in the abdominal area.  You have persistent nausea, vomiting, or diarrhea.  You have a bad smelling vaginal discharge.  You have pain with urination.  You notice increased swelling in your face, hands, legs, or ankles. SEEK IMMEDIATE MEDICAL CARE IF:   You have a fever.  You are leaking fluid from your vagina.  You have spotting or bleeding from your vagina.  You have severe abdominal cramping or pain.  You have rapid weight gain or loss.  You vomit blood or material that looks like coffee grounds.  You are exposed to [place] measles and have never had them.  You are exposed to fifth disease or chickenpox.  You develop a severe headache.  You have shortness of breath.  You have any kind of trauma, such as from a fall or a car accident. Document Released: 05/08/2001 Document Revised: 09/28/2013 Document Reviewed: 03/24/2013 [facility] Patient Information 2015 [place], [place]. This information is not intended to replace advice given to you by your health care provider. Make sure you discuss any questions you have with your health care provider.

## 2020-06-29 ENCOUNTER — Ambulatory Visit (INDEPENDENT_AMBULATORY_CARE_PROVIDER_SITE_OTHER): Payer: Self-pay

## 2020-06-29 ENCOUNTER — Other Ambulatory Visit: Payer: Self-pay

## 2020-06-29 DIAGNOSIS — Z3491 Encounter for supervision of normal pregnancy, unspecified, first trimester: Secondary | ICD-10-CM

## 2020-06-29 NOTE — Progress Notes (Signed)
[place] 8 wks single IUP with YS,CRL 16.09 mm,fhr 157 bpm,normal ovaries

## 2020-07-06 ENCOUNTER — Telehealth: Payer: Self-pay | Admitting: *Deleted

## 2020-07-06 NOTE — Telephone Encounter (Signed)
Patient states she has a some bleeding early this morning, it stopped, then noticed a little dark brown blood this afternoon.  Has not had any since. States she took a shower and while in the shower, she fainted.  Informed patient she could have had a decrease in her blood sugar or blood pressure as patient stated she had a bowl of cereal prior to showering. Advised to eat small frequent snacks high in protein such as meat, cheese, nuts, beans, etc.  Advised to continue to monitor the bleeding and if bleeding started again and was bright red and she had cramping, to let us know.  Pt verbalized understanding and agreeable to plan.

## 2020-07-26 ENCOUNTER — Other Ambulatory Visit: Payer: Self-pay | Admitting: Obstetrics & Gynecology

## 2020-07-26 DIAGNOSIS — Z3682 Encounter for antenatal screening for nuchal translucency: Secondary | ICD-10-CM

## 2020-07-26 DIAGNOSIS — Z419 Encounter for procedure for purposes other than remedying health state, unspecified: Secondary | ICD-10-CM | POA: Diagnosis not present

## 2020-07-27 ENCOUNTER — Ambulatory Visit (INDEPENDENT_AMBULATORY_CARE_PROVIDER_SITE_OTHER): Payer: Medicaid Other

## 2020-07-27 ENCOUNTER — Ambulatory Visit: Payer: Self-pay | Admitting: *Deleted

## 2020-07-27 ENCOUNTER — Ambulatory Visit (INDEPENDENT_AMBULATORY_CARE_PROVIDER_SITE_OTHER): Payer: Self-pay | Admitting: Advanced Practice Midwife

## 2020-07-27 ENCOUNTER — Other Ambulatory Visit: Payer: Self-pay

## 2020-07-27 ENCOUNTER — Encounter: Payer: Self-pay | Admitting: Advanced Practice Midwife

## 2020-07-27 VITALS — BP 125/71 | HR 80 | Wt 185.0 lb

## 2020-07-27 DIAGNOSIS — Z3A12 12 weeks gestation of pregnancy: Secondary | ICD-10-CM | POA: Diagnosis not present

## 2020-07-27 DIAGNOSIS — Z3682 Encounter for antenatal screening for nuchal translucency: Secondary | ICD-10-CM | POA: Diagnosis not present

## 2020-07-27 DIAGNOSIS — O09529 Supervision of elderly multigravida, unspecified trimester: Secondary | ICD-10-CM | POA: Insufficient documentation

## 2020-07-27 DIAGNOSIS — Z31430 Encounter of female for testing for genetic disease carrier status for procreative management: Secondary | ICD-10-CM | POA: Diagnosis not present

## 2020-07-27 DIAGNOSIS — Z3480 Encounter for supervision of other normal pregnancy, unspecified trimester: Secondary | ICD-10-CM

## 2020-07-27 DIAGNOSIS — O09521 Supervision of elderly multigravida, first trimester: Secondary | ICD-10-CM

## 2020-07-27 DIAGNOSIS — Z3490 Encounter for supervision of normal pregnancy, unspecified, unspecified trimester: Secondary | ICD-10-CM | POA: Insufficient documentation

## 2020-07-27 LAB — POCT URINALYSIS DIPSTICK OB
Blood, UA: NEGATIVE
Glucose, UA: NEGATIVE
Leukocytes, UA: NEGATIVE
Nitrite, UA: NEGATIVE

## 2020-07-27 MED ORDER — ASPIRIN 81 MG PO CHEW: 162.0000 mg | CHEWABLE_TABLET | Freq: Every day | ORAL | 7 refills | Status: DC

## 2020-07-27 MED ORDER — BLOOD PRESSURE MONITOR MISC: 0 refills | Status: AC

## 2020-07-27 NOTE — Progress Notes (Signed)
[place] 12 wks,measurements c/w dates,fhr 152 bpm,crl 54.77 mm,NB present,NT 1.2 mm,normal ovaries

## 2020-07-27 NOTE — Progress Notes (Signed)
INITIAL OBSTETRICAL VISIT Patient name: Tina Shepard MRN [id number]  Date of birth: [birth year] Chief Complaint:   Initial Prenatal Visit (Nt/it)  History of Present Illness:   Tina Shepard is a 40 y.o. G78P3003 Hispanic female at [redacted]w[redacted]d by [place] at 8.0 weeks with an Estimated Date of Delivery: 02/08/21 being seen today for her initial obstetrical visit.   Her obstetrical history is significant for advanced maternal age.   Today she reports no complaints.  Depression screen [facility] 2/9 07/27/2020 06/20/2020  Decreased Interest 0 0  Down, Depressed, Hopeless 0 0  PHQ - 2 Score 0 0  Altered sleeping 0 0  Tired, decreased energy 0 0  Change in appetite 0 0  Feeling bad or failure about yourself  0 0  Trouble concentrating 0 0  Moving slowly or fidgety/restless 0 0  Suicidal thoughts 0 0  PHQ-9 Score 0 0    Patient's last menstrual period was 04/10/2020 (lmp unknown). Last pap 2021. Results were: negative per pt report at [facility] Review of Systems:   Pertinent items are noted in HPI Denies cramping/contractions, leakage of fluid, vaginal bleeding, abnormal vaginal discharge w/ itching/odor/irritation, headaches, visual changes, shortness of breath, chest pain, abdominal pain, severe nausea/vomiting, or problems with urination or bowel movements unless otherwise stated above.  Pertinent History Reviewed:  Reviewed past medical,surgical, social, obstetrical and family history.  Reviewed problem list, medications and allergies. OB History  Gravida Para Term Preterm AB Living  4 3 3     3   SAB IAB Ectopic Multiple Live Births          3    # Outcome Date GA Lbr Len/2nd Weight Sex Delivery Anes PTL Lv  4 Current           3 Term 01/26/04 [redacted]w[redacted]d  6 lb 5 oz (2.863 kg) F Vag-Spont None N LIV  2 Term 09/13/98 [redacted]w[redacted]d  6 lb 2 oz (2.778 kg) F  None N LIV  1 Term 05/13/97 [redacted]w[redacted]d  5 lb 4 oz (2.381 kg) F Vag-Spont None N LIV   Physical Assessment:   Vitals:   07/27/20 0839  BP: 125/71  Pulse:  80  Weight: 185 lb (83.9 kg)  Body mass index is 36.13 kg/m.       Physical Examination:  General appearance - well appearing, and in no distress  Mental status - alert, oriented to person, place, and time  Psych:  She has a normal mood and affect  Skin - warm and dry, normal color, no suspicious lesions noted  Chest - effort normal, all lung fields clear to auscultation bilaterally  Heart - normal rate and regular rhythm  Abdomen - soft, nontender  Extremities:  No swelling or varicosities noted  Pelvic - not indicated  Thin prep pap is not done    TODAY'S NT 09/26/20 12 wks,measurements c/w dates,fhr 152 bpm,crl 54.77 mm,NB present,NT 1.2 mm,normal ovaries  No results found for this or any previous visit (from the past 24 hour(s)).  Assessment & Plan:  1) Low-Risk Pregnancy G4P3003 at [redacted]w[redacted]d with an Estimated Date of Delivery: 02/08/21   2) Initial OB visit  3) AMA, <40yo; bASA 162mg  qd   4) Report of elevated BP at [facility], no meds or follow-up; will continue to watch, and bASA already recommended  Meds:  Meds ordered this encounter  Medications  . Blood Pressure Monitor MISC    Sig: For regular home bp monitoring during pregnancy    Dispense:  1  each    Refill:  0    Z34.81  . aspirin 81 MG chewable tablet    Sig: Chew 2 tablets (162 mg total) by mouth daily.    Dispense:  60 tablet    Refill:  7    Order Specific Question:   Supervising Provider    Answer:   Duane Lope H [[id number]]    Initial labs obtained Continue prenatal vitamins Reviewed n/v relief measures and warning s/s to report Reviewed recommended weight gain based on pre-gravid BMI Encouraged well-balanced diet Genetic & carrier screening discussed: requests Panorama and NT/IT, requests Horizon 14  Ultrasound discussed; fetal survey: requested CCNC completed> form faxed if has or is planning to apply for medicaid The nature of [facility] [employer] with multiple MDs and other Advanced  Practice Providers was explained to patient; also emphasized that fellows, residents, and students are part of our team. Given order for home bp cuff. Check bp weekly, let us know if >140/90.   Indications for ASA therapy (per uptodate) OR Two or more of the following: Obesity (BMI>30 kg/m2) Yes Age ?35 years Yes  No indications for early A1C (per uptodate)  Follow-up: Return in about 4 weeks (around 08/24/2020) for LROB, in person, 2nd IT.   Orders Placed This Encounter  Procedures  . Urine Culture  . GC/Chlamydia Probe Amp  . Integrated 1  . Genetic Screening  . Pain Management Screening Profile (10S)  . CBC/D/Plt+RPR+Rh+ABO+Rub Ab...  . POC Urinalysis Dipstick OB    Arabella Merles [facility] 07/27/2020 10:01 AM

## 2020-07-27 NOTE — Patient Instructions (Signed)
Tina Shepard, I greatly value your feedback.  If you receive a survey following your visit with [place] today, we appreciate you taking the time to fill it out.  Thanks, Philipp Deputy CNM   [facility] ([address] [place], [place] [address]) Entrance C, located off of [address] Free 24/7 valet parking   Nausea & Vomiting  Have saltine crackers or pretzels by your bed and eat a few bites before you raise your head out of bed in the morning  Eat small frequent meals throughout the day instead of large meals  Drink plenty of fluids throughout the day to stay hydrated, just don't drink a lot of fluids with your meals.  This can make your stomach fill up faster making you feel sick  Do not brush your teeth right after you eat  Products with real ginger are good for nausea, like ginger ale and ginger hard candy Make sure it says made with real ginger!  Sucking on sour candy like lemon heads is also good for nausea  If your prenatal vitamins make you nauseated, take them at night so you will sleep through the nausea  Sea Bands  If you feel like you need medicine for the nausea & vomiting please let us know  If you are unable to keep any fluids or food down please let us know   Constipation  Drink plenty of fluid, preferably water, throughout the day  Eat foods high in fiber such as fruits, vegetables, and grains  Exercise, such as walking, is a good way to keep your bowels regular  Drink warm fluids, especially warm prune juice, or decaf coffee  Eat a 1/2 cup of real oatmeal (not instant), 1/2 cup applesauce, and 1/2-1 cup warm prune juice every day  If needed, you may take Colace (docusate sodium) stool softener once or twice a day to help keep the stool soft.   If you still are having problems with constipation, you may take Miralax once daily as needed to help keep your bowels regular.   Home Blood Pressure Monitoring for Patients   Your  provider has recommended that you check your blood pressure (BP) at least once a week at home. If you do not have a blood pressure cuff at home, one will be provided for you. Contact your provider if you have not received your monitor within 1 week.   Helpful Tips for Accurate Home Blood Pressure Checks  . Don't smoke, exercise, or drink caffeine 30 minutes before checking your BP . Use the restroom before checking your BP (a full bladder can raise your pressure) . Relax in a comfortable upright chair . Feet on the ground . Left arm resting comfortably on a flat surface at the level of your heart . Legs uncrossed . Back supported . Sit quietly and don't talk . Place the cuff on your bare arm . Adjust snuggly, so that only two fingertips can fit between your skin and the top of the cuff . Check 2 readings separated by at least one minute . Keep a log of your BP readings . For a visual, please reference this diagram: http://ccnc.care/bpdiagram  Provider Name: [facility]     Phone: [phone]  Zone 1: ALL CLEAR  Continue to monitor your symptoms:  . BP reading is less than 140 (top number) or less than 90 (bottom number)  . No right upper stomach pain . No headaches or seeing  spots . No feeling nauseated or throwing up . No swelling in face and hands  Zone 2: CAUTION Call your doctor's office for any of the following:  . BP reading is greater than 140 (top number) or greater than 90 (bottom number)  . Stomach pain under your ribs in the middle or right side . Headaches or seeing spots . Feeling nauseated or throwing up . Swelling in face and hands  Zone 3: EMERGENCY  Seek immediate medical care if you have any of the following:  . BP reading is greater than160 (top number) or greater than 110 (bottom number) . Severe headaches not improving with Tylenol . Serious difficulty catching your breath . Any worsening symptoms from Zone 2    First Trimester of Pregnancy The  first trimester of pregnancy is from week 1 until the end of week 12 (months 1 through 3). A week after a sperm fertilizes an egg, the egg will implant on the wall of the uterus. This embryo will begin to develop into a baby. Genes from you and your partner are forming the baby. The female genes determine whether the baby is a boy or a girl. At 6-8 weeks, the eyes and face are formed, and the heartbeat can be seen on ultrasound. At the end of 12 weeks, all the baby's organs are formed.  Now that you are pregnant, you will want to do everything you can to have a healthy baby. Two of the most important things are to get good prenatal care and to follow your health care provider's instructions. Prenatal care is all the medical care you receive before the baby's birth. This care will help prevent, find, and treat any problems during the pregnancy and childbirth. BODY CHANGES Your body goes through many changes during pregnancy. The changes vary from woman to woman.   You may gain or lose a couple of pounds at first.  You may feel sick to your stomach (nauseous) and throw up (vomit). If the vomiting is uncontrollable, call your health care provider.  You may tire easily.  You may develop headaches that can be relieved by medicines approved by your health care provider.  You may urinate more often. Painful urination may mean you have a bladder infection.  You may develop heartburn as a result of your pregnancy.  You may develop constipation because certain hormones are causing the muscles that push waste through your intestines to slow down.  You may develop hemorrhoids or swollen, bulging veins (varicose veins).  Your breasts may begin to grow larger and become tender. Your nipples may stick out more, and the tissue that surrounds them (areola) may become darker.  Your gums may bleed and may be sensitive to brushing and flossing.  Dark spots or blotches (chloasma, mask of pregnancy) may develop on  your face. This will likely fade after the baby is born.  Your menstrual periods will stop.  You may have a loss of appetite.  You may develop cravings for certain kinds of food.  You may have changes in your emotions from day to day, such as being excited to be pregnant or being concerned that something may go wrong with the pregnancy and baby.  You may have more vivid and strange dreams.  You may have changes in your hair. These can include thickening of your hair, rapid growth, and changes in texture. Some women also have hair loss during or after pregnancy, or hair that feels dry or thin. Your hair  will most likely return to normal after your baby is born. WHAT TO EXPECT AT YOUR PRENATAL VISITS During a routine prenatal visit:  You will be weighed to make sure you and the baby are growing normally.  Your blood pressure will be taken.  Your abdomen will be measured to track your baby's growth.  The fetal heartbeat will be listened to starting around week 10 or 12 of your pregnancy.  Test results from any previous visits will be discussed. Your health care provider may ask you:  How you are feeling.  If you are feeling the baby move.  If you have had any abnormal symptoms, such as leaking fluid, bleeding, severe headaches, or abdominal cramping.  If you have any questions. Other tests that may be performed during your first trimester include:  Blood tests to find your blood type and to check for the presence of any previous infections. They will also be used to check for low iron levels (anemia) and Rh antibodies. Later in the pregnancy, blood tests for diabetes will be done along with other tests if problems develop.  Urine tests to check for infections, diabetes, or protein in the urine.  An ultrasound to confirm the proper growth and development of the baby.  An amniocentesis to check for possible genetic problems.  Fetal screens for spina bifida and Down  syndrome.  You may need other tests to make sure you and the baby are doing well. HOME CARE INSTRUCTIONS  Medicines  Follow your health care provider's instructions regarding medicine use. Specific medicines may be either safe or unsafe to take during pregnancy.  Take your prenatal vitamins as directed.  If you develop constipation, try taking a stool softener if your health care provider approves. Diet  Eat regular, well-balanced meals. Choose a variety of foods, such as meat or vegetable-based protein, fish, milk and low-fat dairy products, vegetables, fruits, and whole grain breads and cereals. Your health care provider will help you determine the amount of weight gain that is right for you.  Avoid raw meat and uncooked cheese. These carry germs that can cause birth defects in the baby.  Eating four or five small meals rather than three large meals a day may help relieve nausea and vomiting. If you start to feel nauseous, eating a few soda crackers can be helpful. Drinking liquids between meals instead of during meals also seems to help nausea and vomiting.  If you develop constipation, eat more high-fiber foods, such as fresh vegetables or fruit and whole grains. Drink enough fluids to keep your urine clear or pale yellow. Activity and Exercise  Exercise only as directed by your health care provider. Exercising will help you:  Control your weight.  Stay in shape.  Be prepared for labor and delivery.  Experiencing pain or cramping in the lower abdomen or low back is a good sign that you should stop exercising. Check with your health care provider before continuing normal exercises.  Try to avoid standing for long periods of time. Move your legs often if you must stand in one place for a long time.  Avoid heavy lifting.  Wear low-heeled shoes, and practice good posture.  You may continue to have sex unless your health care provider directs you otherwise. Relief of Pain or  Discomfort  Wear a good support bra for breast tenderness.    Take warm sitz baths to soothe any pain or discomfort caused by hemorrhoids. Use hemorrhoid cream if your health care provider approves.  Rest with your legs elevated if you have leg cramps or low back pain.  If you develop varicose veins in your legs, wear support hose. Elevate your feet for 15 minutes, 3-4 times a day. Limit salt in your diet. Prenatal Care  Schedule your prenatal visits by the twelfth week of pregnancy. They are usually scheduled monthly at first, then more often in the last 2 months before delivery.  Write down your questions. Take them to your prenatal visits.  Keep all your prenatal visits as directed by your health care provider. Safety  Wear your seat belt at all times when driving.  Make a list of emergency phone numbers, including numbers for family, friends, the hospital, and police and fire departments. General Tips  Ask your health care provider for a referral to a local prenatal education class. Begin classes no later than at the beginning of month 6 of your pregnancy.  Ask for help if you have counseling or nutritional needs during pregnancy. Your health care provider can offer advice or refer you to specialists for help with various needs.  Do not use hot tubs, steam rooms, or saunas.  Do not douche or use tampons or scented sanitary pads.  Do not cross your legs for long periods of time.  Avoid cat litter boxes and soil used by cats. These carry germs that can cause birth defects in the baby and possibly loss of the fetus by miscarriage or stillbirth.  Avoid all smoking, herbs, alcohol, and medicines not prescribed by your health care provider. Chemicals in these affect the formation and growth of the baby.  Schedule a dentist appointment. At home, brush your teeth with a soft toothbrush and be gentle when you floss. SEEK MEDICAL CARE IF:   You have dizziness.  You have mild  pelvic cramps, pelvic pressure, or nagging pain in the abdominal area.  You have persistent nausea, vomiting, or diarrhea.  You have a bad smelling vaginal discharge.  You have pain with urination.  You notice increased swelling in your face, hands, legs, or ankles. SEEK IMMEDIATE MEDICAL CARE IF:   You have a fever.  You are leaking fluid from your vagina.  You have spotting or bleeding from your vagina.  You have severe abdominal cramping or pain.  You have rapid weight gain or loss.  You vomit blood or material that looks like coffee grounds.  You are exposed to [place] measles and have never had them.  You are exposed to fifth disease or chickenpox.  You develop a severe headache.  You have shortness of breath.  You have any kind of trauma, such as from a fall or a car accident. Document Released: 05/08/2001 Document Revised: 09/28/2013 Document Reviewed: 03/24/2013 [facility] Patient Information 2015 [place], [place]. This information is not intended to replace advice given to you by your health care provider. Make sure you discuss any questions you have with your health care provider.

## 2020-07-28 LAB — INTEGRATED 1

## 2020-07-28 LAB — CBC/D/PLT+RPR+RH+ABO+RUB AB...
Basophils Absolute: 0 10*3/uL (ref 0.0–0.2)
HIV Screen 4th Generation wRfx: NONREACTIVE
Hemoglobin: 14.2 g/dL (ref 11.1–15.9)
Lymphs: 21 %
Monocytes: 5 %

## 2020-07-28 LAB — PMP SCREEN PROFILE (10S), URINE
Amphetamine Scrn, Ur: NEGATIVE ng/mL
BARBITURATE SCREEN URINE: NEGATIVE ng/mL
BENZODIAZEPINE SCREEN, URINE: NEGATIVE ng/mL
CANNABINOIDS UR QL SCN: NEGATIVE ng/mL
Cocaine (Metab) Scrn, Ur: NEGATIVE ng/mL
Creatinine(Crt), U: 313.4 mg/dL — ABNORMAL HIGH (ref 20.0–300.0)
Methadone Screen, Urine: NEGATIVE ng/mL
OXYCODONE+OXYMORPHONE UR QL SCN: NEGATIVE ng/mL
Opiate Scrn, Ur: NEGATIVE ng/mL
Ph of Urine: 5.5 (ref 4.5–8.9)
Phencyclidine Qn, Ur: NEGATIVE ng/mL
Propoxyphene Scrn, Ur: NEGATIVE ng/mL

## 2020-07-29 LAB — INTEGRATED 1
Crown Rump Length: 54.8 mm
Gest. Age on Collection Date: 11.9 weeks
Maternal Age at EDD: 38.5 yr
Nuchal Translucency (NT): 1.2 mm
Number of Fetuses: 1
PAPP-A Value: 631.2 ng/mL
Weight: 185 [lb_av]

## 2020-07-29 LAB — HCV INTERPRETATION

## 2020-07-29 LAB — CBC/D/PLT+RPR+RH+ABO+RUB AB...
Antibody Screen: NEGATIVE
Basos: 0 %
EOS (ABSOLUTE): 0.1 10*3/uL (ref 0.0–0.4)
Eos: 1 %
HCV Ab: <0.1 s/co ratio (ref 0.0–0.9)
Hematocrit: 41.2 % (ref 34.0–46.6)
Hepatitis B Surface Ag: NEGATIVE
Immature Grans (Abs): 0 10*3/uL (ref 0.0–0.1)
Immature Granulocytes: 0 %
Lymphocytes Absolute: 1.9 10*3/uL (ref 0.7–3.1)
MCH: 30 pg (ref 26.6–33.0)
MCHC: 34.5 g/dL (ref 31.5–35.7)
MCV: 87 fL (ref 79–97)
Monocytes Absolute: 0.5 10*3/uL (ref 0.1–0.9)
Neutrophils Absolute: 6.3 10*3/uL (ref 1.4–7.0)
Neutrophils: 73 %
Platelets: 288 10*3/uL (ref 150–450)
RBC: 4.74 x10E6/uL (ref 3.77–5.28)
RDW: 13.5 % (ref 11.7–15.4)
RPR Ser Ql: NONREACTIVE
Rh Factor: POSITIVE
Rubella Antibodies, IGG: 1 index (ref >0.99)
WBC: 8.8 10*3/uL (ref 3.4–10.8)

## 2020-07-29 LAB — GC/CHLAMYDIA PROBE AMP
Chlamydia trachomatis, NAA: NEGATIVE
Neisseria Gonorrhoeae by PCR: NEGATIVE

## 2020-07-29 LAB — URINE CULTURE

## 2020-08-05 DIAGNOSIS — Z3480 Encounter for supervision of other normal pregnancy, unspecified trimester: Secondary | ICD-10-CM | POA: Diagnosis not present

## 2020-08-09 ENCOUNTER — Telehealth: Payer: Self-pay | Admitting: Women's Health

## 2020-08-09 NOTE — Telephone Encounter (Signed)
Patient called and left a message stating that she had blood work done to find out the gender of the baby and she would like a call back with those results. Please contact pt

## 2020-08-10 NOTE — Telephone Encounter (Signed)
Returned pt's call, no answer at either phone number, no ability to leave a vm.

## 2020-08-23 ENCOUNTER — Encounter: Payer: Self-pay | Admitting: Advanced Practice Midwife

## 2020-08-24 ENCOUNTER — Ambulatory Visit (INDEPENDENT_AMBULATORY_CARE_PROVIDER_SITE_OTHER): Payer: Medicaid Other | Admitting: Women's Health

## 2020-08-24 ENCOUNTER — Encounter: Payer: Self-pay | Admitting: Women's Health

## 2020-08-24 ENCOUNTER — Other Ambulatory Visit: Payer: Self-pay

## 2020-08-24 VITALS — BP 121/71 | HR 93 | Wt 185.0 lb

## 2020-08-24 DIAGNOSIS — Z3480 Encounter for supervision of other normal pregnancy, unspecified trimester: Secondary | ICD-10-CM

## 2020-08-24 DIAGNOSIS — Z1379 Encounter for other screening for genetic and chromosomal anomalies: Secondary | ICD-10-CM | POA: Diagnosis not present

## 2020-08-24 DIAGNOSIS — Z3482 Encounter for supervision of other normal pregnancy, second trimester: Secondary | ICD-10-CM

## 2020-08-24 DIAGNOSIS — Z363 Encounter for antenatal screening for malformations: Secondary | ICD-10-CM

## 2020-08-24 NOTE — Patient Instructions (Signed)
Tina Shepard, I greatly value your feedback.  If you receive a survey following your visit with [place] today, we appreciate you taking the time to fill it out.  Thanks, [name], CNM, WHNP-BC  [facility] ([address] [place], [place] [address]) Entrance C, located off of [address] valet parking  Go to [email or web address] to register for FREE online childbirth classes  [place] Pediatricians/Family Doctors:  [facility] [phone]            [facility] [phone]                 [facility] [phone] (usually not accepting new patients unless you have family there already, you are always welcome to call and ask)       [facility] Department [phone]       [facility] Pediatricians/Family Doctors:   [facility]: [phone]  [facility]: [phone]  [facility]: [phone]  [facility] Doctors:   [facility]: [phone]   [facility]: [phone]  [facility] Doctors:  [facility]: [phone]    Home Blood Pressure Monitoring for Patients   Your provider has recommended that you check your blood pressure (BP) at least once a week at home. If you do not have a blood pressure cuff at home, one will be provided for you. Contact your provider if you have not received your monitor within 1 week.   Helpful Tips for Accurate Home Blood Pressure Checks  . Don't smoke, exercise, or drink caffeine 30 minutes before checking your BP . Use the restroom before checking your BP (a full bladder can raise your pressure) . Relax in a comfortable upright chair . Feet on the ground . Left arm resting comfortably on a flat surface at the level of your heart . Legs uncrossed . Back supported . Sit quietly and don't talk . Place the cuff on your bare arm . Adjust snuggly,  so that only two fingertips can fit between your skin and the top of the cuff . Check 2 readings separated by at least one minute . Keep a log of your BP readings . For a visual, please reference this diagram: http://ccnc.care/bpdiagram  Provider Name: [facility]     Phone: [phone]  Zone 1: ALL CLEAR  Continue to monitor your symptoms:  . BP reading is less than 140 (top number) or less than 90 (bottom number)  . No right upper stomach pain . No headaches or seeing spots . No feeling nauseated or throwing up . No swelling in face and hands  Zone 2: CAUTION Call your doctor's office for any of the following:  . BP reading is greater than 140 (top number) or greater than 90 (bottom number)  . Stomach pain under your ribs in the middle or right side . Headaches or seeing spots . Feeling nauseated or throwing up . Swelling in face and hands  Zone 3: EMERGENCY  Seek immediate medical care if you have any of the following:  . BP reading is greater than160 (top number) or greater than 110 (bottom number) . Severe headaches not improving with Tylenol . Serious difficulty catching your breath . Any worsening symptoms from Zone 2     Second Trimester of Pregnancy The second trimester is from week 14 through week 27 (months 4 through 6). The second trimester is often a time when you feel your  best. Your body has adjusted to being pregnant, and you begin to feel better physically. Usually, morning sickness has lessened or quit completely, you may have more energy, and you may have an increase in appetite. The second trimester is also a time when the fetus is growing rapidly. At the end of the sixth month, the fetus is about 9 inches long and weighs about 1 pounds. You will likely begin to feel the baby move (quickening) between 16 and 20 weeks of pregnancy. Body changes during your second trimester Your body continues to go through many changes during your second trimester. The  changes vary from woman to woman.  Your weight will continue to increase. You will notice your lower abdomen bulging out.  You may begin to get stretch marks on your hips, abdomen, and breasts.  You may develop headaches that can be relieved by medicines. The medicines should be approved by your health care provider.  You may urinate more often because the fetus is pressing on your bladder.  You may develop or continue to have heartburn as a result of your pregnancy.  You may develop constipation because certain hormones are causing the muscles that push waste through your intestines to slow down.  You may develop hemorrhoids or swollen, bulging veins (varicose veins).  You may have back pain. This is caused by: ? Weight gain. ? Pregnancy hormones that are relaxing the joints in your pelvis. ? A shift in weight and the muscles that support your balance.  Your breasts will continue to grow and they will continue to become tender.  Your gums may bleed and may be sensitive to brushing and flossing.  Dark spots or blotches (chloasma, mask of pregnancy) may develop on your face. This will likely fade after the baby is born.  A dark line from your belly button to the pubic area (linea nigra) may appear. This will likely fade after the baby is born.  You may have changes in your hair. These can include thickening of your hair, rapid growth, and changes in texture. Some women also have hair loss during or after pregnancy, or hair that feels dry or thin. Your hair will most likely return to normal after your baby is born.  What to expect at prenatal visits During a routine prenatal visit:  You will be weighed to make sure you and the fetus are growing normally.  Your blood pressure will be taken.  Your abdomen will be measured to track your baby's growth.  The fetal heartbeat will be listened to.  Any test results from the previous visit will be discussed.  Your health care  provider may ask you:  How you are feeling.  If you are feeling the baby move.  If you have had any abnormal symptoms, such as leaking fluid, bleeding, severe headaches, or abdominal cramping.  If you are using any tobacco products, including cigarettes, chewing tobacco, and electronic cigarettes.  If you have any questions.  Other tests that may be performed during your second trimester include:  Blood tests that check for: ? Low iron levels (anemia). ? High blood sugar that affects pregnant women (gestational diabetes) between 71 and 28 weeks. ? Rh antibodies. This is to check for a protein on red blood cells (Rh factor).  Urine tests to check for infections, diabetes, or protein in the urine.  An ultrasound to confirm the proper growth and development of the baby.  An amniocentesis to check for possible genetic problems.  Fetal  screens for spina bifida and Down syndrome.  HIV (human immunodeficiency virus) testing. Routine prenatal testing includes screening for HIV, unless you choose not to have this test.  Follow these instructions at home: Medicines  Follow your health care provider's instructions regarding medicine use. Specific medicines may be either safe or unsafe to take during pregnancy.  Take a prenatal vitamin that contains at least 600 micrograms (mcg) of folic acid.  If you develop constipation, try taking a stool softener if your health care provider approves. Eating and drinking  Eat a balanced diet that includes fresh fruits and vegetables, whole grains, good sources of protein such as meat, eggs, or tofu, and low-fat dairy. Your health care provider will help you determine the amount of weight gain that is right for you.  Avoid raw meat and uncooked cheese. These carry germs that can cause birth defects in the baby.  If you have low calcium intake from food, talk to your health care provider about whether you should take a daily calcium  supplement.  Limit foods that are high in fat and processed sugars, such as fried and sweet foods.  To prevent constipation: ? Drink enough fluid to keep your urine clear or pale yellow. ? Eat foods that are high in fiber, such as fresh fruits and vegetables, whole grains, and beans. Activity  Exercise only as directed by your health care provider. Most women can continue their usual exercise routine during pregnancy. Try to exercise for 30 minutes at least 5 days a week. Stop exercising if you experience uterine contractions.  Avoid heavy lifting, wear low heel shoes, and practice good posture.  A sexual relationship may be continued unless your health care provider directs you otherwise. Relieving pain and discomfort  Wear a good support bra to prevent discomfort from breast tenderness.  Take warm sitz baths to soothe any pain or discomfort caused by hemorrhoids. Use hemorrhoid cream if your health care provider approves.  Rest with your legs elevated if you have leg cramps or low back pain.  If you develop varicose veins, wear support hose. Elevate your feet for 15 minutes, 3-4 times a day. Limit salt in your diet. Prenatal Care  Write down your questions. Take them to your prenatal visits.  Keep all your prenatal visits as told by your health care provider. This is important. Safety  Wear your seat belt at all times when driving.  Make a list of emergency phone numbers, including numbers for family, friends, the hospital, and police and fire departments. General instructions  Ask your health care provider for a referral to a local prenatal education class. Begin classes no later than the beginning of month 6 of your pregnancy.  Ask for help if you have counseling or nutritional needs during pregnancy. Your health care provider can offer advice or refer you to specialists for help with various needs.  Do not use hot tubs, steam rooms, or saunas.  Do not douche or use  tampons or scented sanitary pads.  Do not cross your legs for long periods of time.  Avoid cat litter boxes and soil used by cats. These carry germs that can cause birth defects in the baby and possibly loss of the fetus by miscarriage or stillbirth.  Avoid all smoking, herbs, alcohol, and unprescribed drugs. Chemicals in these products can affect the formation and growth of the baby.  Do not use any products that contain nicotine or tobacco, such as cigarettes and e-cigarettes. If you need help  quitting, ask your health care provider.  Visit your dentist if you have not gone yet during your pregnancy. Use a soft toothbrush to brush your teeth and be gentle when you floss. Contact a health care provider if:  You have dizziness.  You have mild pelvic cramps, pelvic pressure, or nagging pain in the abdominal area.  You have persistent nausea, vomiting, or diarrhea.  You have a bad smelling vaginal discharge.  You have pain when you urinate. Get help right away if:  You have a fever.  You are leaking fluid from your vagina.  You have spotting or bleeding from your vagina.  You have severe abdominal cramping or pain.  You have rapid weight gain or weight loss.  You have shortness of breath with chest pain.  You notice sudden or extreme swelling of your face, hands, ankles, feet, or legs.  You have not felt your baby move in over an hour.  You have severe headaches that do not go away when you take medicine.  You have vision changes. Summary  The second trimester is from week 14 through week 27 (months 4 through 6). It is also a time when the fetus is growing rapidly.  Your body goes through many changes during pregnancy. The changes vary from woman to woman.  Avoid all smoking, herbs, alcohol, and unprescribed drugs. These chemicals affect the formation and growth your baby.  Do not use any tobacco products, such as cigarettes, chewing tobacco, and e-cigarettes. If you  need help quitting, ask your health care provider.  Contact your health care provider if you have any questions. Keep all prenatal visits as told by your health care provider. This is important. This information is not intended to replace advice given to you by your health care provider. Make sure you discuss any questions you have with your health care provider. Document Released: 05/08/2001 Document Revised: 10/20/2015 Document Reviewed: 07/15/2012 Elsevier Interactive Patient Education  2017 [employer]   

## 2020-08-24 NOTE — Progress Notes (Signed)
   LOW-RISK PREGNANCY VISIT Patient name: Tina Shepard MRN [id number]  Date of birth: 06-10-1980 Chief Complaint:   Routine Prenatal Visit  History of Present Illness:   Tina Shepard is a 40 y.o. G53P3003 female at [redacted]w[redacted]d with an Estimated Date of Delivery: 02/08/21 being seen today for ongoing management of a low-risk pregnancy.  Depression screen [facility] 2/9 07/27/2020 06/20/2020  Decreased Interest 0 0  Down, Depressed, Hopeless 0 0  PHQ - 2 Score 0 0  Altered sleeping 0 0  Tired, decreased energy 0 0  Change in appetite 0 0  Feeling bad or failure about yourself  0 0  Trouble concentrating 0 0  Moving slowly or fidgety/restless 0 0  Suicidal thoughts 0 0  PHQ-9 Score 0 0    Today she reports no complaints. Contractions: Not present. Vag. Bleeding: None.  Movement: Present. denies leaking of fluid. Review of Systems:   Pertinent items are noted in HPI Denies abnormal vaginal discharge w/ itching/odor/irritation, headaches, visual changes, shortness of breath, chest pain, abdominal pain, severe nausea/vomiting, or problems with urination or bowel movements unless otherwise stated above. Pertinent History Reviewed:  Reviewed past medical,surgical, social, obstetrical and family history.  Reviewed problem list, medications and allergies. Physical Assessment:   Vitals:   08/24/20 0958  BP: 121/71  Pulse: 93  Weight: 185 lb (83.9 kg)  Body mass index is 36.13 kg/m.        Physical Examination:   General appearance: Well appearing, and in no distress  Mental status: Alert, oriented to person, place, and time  Skin: Warm & dry  Cardiovascular: Normal heart rate noted  Respiratory: Normal respiratory effort, no distress  Abdomen: Soft, gravid, nontender  Pelvic: Cervical exam deferred         Extremities: Edema: None  Fetal Status: Fetal Heart Rate (bpm): 148   Movement: Present    Chaperone: N/A   No results found for this or any previous visit (from the past 24  hour(s)).  Assessment & Plan:  1) Low-risk pregnancy G4P3003 at [redacted]w[redacted]d with an Estimated Date of Delivery: 02/08/21    Meds: No orders of the defined types were placed in this encounter.  Labs/procedures today: 2nd IT  Plan:  Continue routine obstetrical care  Next visit: prefers will be in person for u/s    Reviewed: Preterm labor symptoms and general obstetric precautions including but not limited to vaginal bleeding, contractions, leaking of fluid and fetal movement were reviewed in detail with the patient.  All questions were answered.  Follow-up: Return in about 2 weeks (around 09/07/2020) for LROB, HF:WYOVZCH, CNM, in person.  No future appointments.  Orders Placed This Encounter  Procedures  . US OB Comp + 14 Wk  . INTEGRATED 2   Cheral Marker CNM, [facility] 08/24/2020 10:23 AM

## 2020-08-26 DIAGNOSIS — Z419 Encounter for procedure for purposes other than remedying health state, unspecified: Secondary | ICD-10-CM | POA: Diagnosis not present

## 2020-08-26 LAB — INTEGRATED 2
AFP MoM: 1.01
Alpha-Fetoprotein: 28.1 ng/mL
Crown Rump Length: 54.8 mm
DIA MoM: 0.64
DIA Value: 88.7 pg/mL
Estriol, Unconjugated: 1.22 ng/mL
Gest. Age on Collection Date: 11.9 weeks
Gestational Age: 15.9 weeks
Maternal Age at EDD: 38.5 yr
Nuchal Translucency (NT): 1.2 mm
Nuchal Translucency MoM: 0.98
Number of Fetuses: 1
PAPP-A MoM: 1.04
PAPP-A Value: 631.2 ng/mL
Test Results:: NEGATIVE
Weight: 185 [lb_av]
Weight: 185 [lb_av]
hCG MoM: 0.96
hCG Value: 31.8 IU/mL
uE3 MoM: 1.48

## 2020-09-07 ENCOUNTER — Other Ambulatory Visit: Payer: Self-pay

## 2020-09-07 ENCOUNTER — Ambulatory Visit (INDEPENDENT_AMBULATORY_CARE_PROVIDER_SITE_OTHER): Payer: Medicaid Other

## 2020-09-07 ENCOUNTER — Ambulatory Visit (INDEPENDENT_AMBULATORY_CARE_PROVIDER_SITE_OTHER): Payer: Medicaid Other | Admitting: Advanced Practice Midwife

## 2020-09-07 ENCOUNTER — Encounter: Payer: Self-pay | Admitting: Advanced Practice Midwife

## 2020-09-07 VITALS — BP 115/77 | HR 85 | Wt 183.0 lb

## 2020-09-07 DIAGNOSIS — Z3A18 18 weeks gestation of pregnancy: Secondary | ICD-10-CM

## 2020-09-07 DIAGNOSIS — O4440 Low lying placenta NOS or without hemorrhage, unspecified trimester: Secondary | ICD-10-CM | POA: Insufficient documentation

## 2020-09-07 DIAGNOSIS — Z3482 Encounter for supervision of other normal pregnancy, second trimester: Secondary | ICD-10-CM

## 2020-09-07 DIAGNOSIS — Z363 Encounter for antenatal screening for malformations: Secondary | ICD-10-CM | POA: Diagnosis not present

## 2020-09-07 DIAGNOSIS — Z3480 Encounter for supervision of other normal pregnancy, unspecified trimester: Secondary | ICD-10-CM

## 2020-09-07 DIAGNOSIS — O09522 Supervision of elderly multigravida, second trimester: Secondary | ICD-10-CM

## 2020-09-07 NOTE — Progress Notes (Signed)
   LOW-RISK PREGNANCY VISIT Patient name: Tina Shepard MRN [id number]  Date of birth: 05/17/1981 Chief Complaint:   Routine Prenatal Visit (Anatomy scan)  History of Present Illness:   Tina Shepard is a 40 y.o. G68P3003 female at [redacted]w[redacted]d with an Estimated Date of Delivery: 02/08/21 being seen today for ongoing management of a low-risk pregnancy.  Today she reports no complaints. Contractions: Not present. Vag. Bleeding: None.  Movement: Present. denies leaking of fluid. Review of Systems:   Pertinent items are noted in HPI Denies abnormal vaginal discharge w/ itching/odor/irritation, headaches, visual changes, shortness of breath, chest pain, abdominal pain, severe nausea/vomiting, or problems with urination or bowel movements unless otherwise stated above. Pertinent History Reviewed:  Reviewed past medical,surgical, social, obstetrical and family history.  Reviewed problem list, medications and allergies. Physical Assessment:   Vitals:   09/07/20 1228  BP: 115/77  Pulse: 85  Weight: 183 lb (83 kg)  Body mass index is 35.74 kg/m.        Physical Examination:   General appearance: Well appearing, and in no distress  Mental status: Alert, oriented to person, place, and time  Skin: Warm & dry  Cardiovascular: Normal heart rate noted  Respiratory: Normal respiratory effort, no distress  Abdomen: Soft, gravid, nontender  Pelvic: Cervical exam deferred         Extremities: Edema: None  Fetal Status: Fetal Heart Rate (bpm): 140 u/s   Movement: Present     Anatomy scan: [place] 18 wks,breech,posterior low lying placenta gr 0, tip of placenta to cx 1.5 cm,normal ovaries,cx length 1.8 cm,svp of fluid 5.8 cm,EFW 220 G 46%,anatomy complete (CX LENGTH 2.8cm on report)  No results found for this or any previous visit (from the past 24 hour(s)).  Assessment & Plan:  1) Low-risk pregnancy G4P3003 at [redacted]w[redacted]d with an Estimated Date of Delivery: 02/08/21   2) Low lying placenta, 1.5cm from os,  will repeat at 28wks   Meds: No orders of the defined types were placed in this encounter.  Labs/procedures today: anatomy scan  Plan:  Continue routine obstetrical care   Reviewed: Preterm labor symptoms and general obstetric precautions including but not limited to vaginal bleeding, contractions, leaking of fluid and fetal movement were reviewed in detail with the patient.  All questions were answered. Has home bp cuff. Check bp weekly, let us know if >140/90.   Follow-up: Return in about 4 weeks (around 10/05/2020) for LROB, in person.  No orders of the defined types were placed in this encounter.  Arabella Merles CNM 09/07/2020 1:14 PM

## 2020-09-07 NOTE — Progress Notes (Signed)
[place] 18 wks,breech,posterior low lying placenta gr 0, tip of placenta to cx 1.5 cm,normal ovaries,cx length 1.8 cm,svp of fluid 5.8 cm,EFW 220 G 46%,anatomy complete

## 2020-09-07 NOTE — Patient Instructions (Signed)
Tina Shepard, I greatly value your feedback.  If you receive a survey following your visit with [place] today, we appreciate you taking the time to fill it out.  Thanks, Philipp Deputy CNM  [facility] ([address] [place], [place] [address]) Entrance C, located off of [address] valet parking  Go to [email or web address] to register for FREE online childbirth classes  [place] Pediatricians/Family Doctors:  [facility] [phone]            [facility] [phone]                 [facility] [phone] (usually not accepting new patients unless you have family there already, you are always welcome to call and ask)       [facility] Department [phone]       [facility] Pediatricians/Family Doctors:   [facility]: [phone]  [facility]: [phone]  [facility]: [phone]  [facility] Doctors:   [facility]: [phone]   [facility]: [phone]  [facility] Doctors:  [facility]: [phone]    Home Blood Pressure Monitoring for Patients   Your provider has recommended that you check your blood pressure (BP) at least once a week at home. If you do not have a blood pressure cuff at home, one will be provided for you. Contact your provider if you have not received your monitor within 1 week.   Helpful Tips for Accurate Home Blood Pressure Checks  . Don't smoke, exercise, or drink caffeine 30 minutes before checking your BP . Use the restroom before checking your BP (a full bladder can raise your pressure) . Relax in a comfortable upright chair . Feet on the ground . Left arm resting comfortably on a flat surface at the level of your heart . Legs uncrossed . Back supported . Sit quietly and don't talk . Place the cuff on your bare arm . Adjust snuggly, so that only  two fingertips can fit between your skin and the top of the cuff . Check 2 readings separated by at least one minute . Keep a log of your BP readings . For a visual, please reference this diagram: http://ccnc.care/bpdiagram  Provider Name: [facility]     Phone: [phone]  Zone 1: ALL CLEAR  Continue to monitor your symptoms:  . BP reading is less than 140 (top number) or less than 90 (bottom number)  . No right upper stomach pain . No headaches or seeing spots . No feeling nauseated or throwing up . No swelling in face and hands  Zone 2: CAUTION Call your doctor's office for any of the following:  . BP reading is greater than 140 (top number) or greater than 90 (bottom number)  . Stomach pain under your ribs in the middle or right side . Headaches or seeing spots . Feeling nauseated or throwing up . Swelling in face and hands  Zone 3: EMERGENCY  Seek immediate medical care if you have any of the following:  . BP reading is greater than160 (top number) or greater than 110 (bottom number) . Severe headaches not improving with Tylenol . Serious difficulty catching your breath . Any worsening symptoms from Zone 2     Second Trimester of Pregnancy The second trimester is from week 14 through week 27 (months 4 through 6). The second trimester is often a time when you feel your best.  Your body has adjusted to being pregnant, and you begin to feel better physically. Usually, morning sickness has lessened or quit completely, you may have more energy, and you may have an increase in appetite. The second trimester is also a time when the fetus is growing rapidly. At the end of the sixth month, the fetus is about 9 inches long and weighs about 1 pounds. You will likely begin to feel the baby move (quickening) between 16 and 20 weeks of pregnancy. Body changes during your second trimester Your body continues to go through many changes during your second trimester. The changes vary  from woman to woman.  Your weight will continue to increase. You will notice your lower abdomen bulging out.  You may begin to get stretch marks on your hips, abdomen, and breasts.  You may develop headaches that can be relieved by medicines. The medicines should be approved by your health care provider.  You may urinate more often because the fetus is pressing on your bladder.  You may develop or continue to have heartburn as a result of your pregnancy.  You may develop constipation because certain hormones are causing the muscles that push waste through your intestines to slow down.  You may develop hemorrhoids or swollen, bulging veins (varicose veins).  You may have back pain. This is caused by: ? Weight gain. ? Pregnancy hormones that are relaxing the joints in your pelvis. ? A shift in weight and the muscles that support your balance.  Your breasts will continue to grow and they will continue to become tender.  Your gums may bleed and may be sensitive to brushing and flossing.  Dark spots or blotches (chloasma, mask of pregnancy) may develop on your face. This will likely fade after the baby is born.  A dark line from your belly button to the pubic area (linea nigra) may appear. This will likely fade after the baby is born.  You may have changes in your hair. These can include thickening of your hair, rapid growth, and changes in texture. Some women also have hair loss during or after pregnancy, or hair that feels dry or thin. Your hair will most likely return to normal after your baby is born.  What to expect at prenatal visits During a routine prenatal visit:  You will be weighed to make sure you and the fetus are growing normally.  Your blood pressure will be taken.  Your abdomen will be measured to track your baby's growth.  The fetal heartbeat will be listened to.  Any test results from the previous visit will be discussed.  Your health care provider may ask  you:  How you are feeling.  If you are feeling the baby move.  If you have had any abnormal symptoms, such as leaking fluid, bleeding, severe headaches, or abdominal cramping.  If you are using any tobacco products, including cigarettes, chewing tobacco, and electronic cigarettes.  If you have any questions.  Other tests that may be performed during your second trimester include:  Blood tests that check for: ? Low iron levels (anemia). ? High blood sugar that affects pregnant women (gestational diabetes) between 14 and 28 weeks. ? Rh antibodies. This is to check for a protein on red blood cells (Rh factor).  Urine tests to check for infections, diabetes, or protein in the urine.  An ultrasound to confirm the proper growth and development of the baby.  An amniocentesis to check for possible genetic problems.  Fetal screens  for spina bifida and Down syndrome.  HIV (human immunodeficiency virus) testing. Routine prenatal testing includes screening for HIV, unless you choose not to have this test.  Follow these instructions at home: Medicines  Follow your health care provider's instructions regarding medicine use. Specific medicines may be either safe or unsafe to take during pregnancy.  Take a prenatal vitamin that contains at least 600 micrograms (mcg) of folic acid.  If you develop constipation, try taking a stool softener if your health care provider approves. Eating and drinking  Eat a balanced diet that includes fresh fruits and vegetables, whole grains, good sources of protein such as meat, eggs, or tofu, and low-fat dairy. Your health care provider will help you determine the amount of weight gain that is right for you.  Avoid raw meat and uncooked cheese. These carry germs that can cause birth defects in the baby.  If you have low calcium intake from food, talk to your health care provider about whether you should take a daily calcium supplement.  Limit foods that  are high in fat and processed sugars, such as fried and sweet foods.  To prevent constipation: ? Drink enough fluid to keep your urine clear or pale yellow. ? Eat foods that are high in fiber, such as fresh fruits and vegetables, whole grains, and beans. Activity  Exercise only as directed by your health care provider. Most women can continue their usual exercise routine during pregnancy. Try to exercise for 30 minutes at least 5 days a week. Stop exercising if you experience uterine contractions.  Avoid heavy lifting, wear low heel shoes, and practice good posture.  A sexual relationship may be continued unless your health care provider directs you otherwise. Relieving pain and discomfort  Wear a good support bra to prevent discomfort from breast tenderness.  Take warm sitz baths to soothe any pain or discomfort caused by hemorrhoids. Use hemorrhoid cream if your health care provider approves.  Rest with your legs elevated if you have leg cramps or low back pain.  If you develop varicose veins, wear support hose. Elevate your feet for 15 minutes, 3-4 times a day. Limit salt in your diet. Prenatal Care  Write down your questions. Take them to your prenatal visits.  Keep all your prenatal visits as told by your health care provider. This is important. Safety  Wear your seat belt at all times when driving.  Make a list of emergency phone numbers, including numbers for family, friends, the hospital, and police and fire departments. General instructions  Ask your health care provider for a referral to a local prenatal education class. Begin classes no later than the beginning of month 6 of your pregnancy.  Ask for help if you have counseling or nutritional needs during pregnancy. Your health care provider can offer advice or refer you to specialists for help with various needs.  Do not use hot tubs, steam rooms, or saunas.  Do not douche or use tampons or scented sanitary  pads.  Do not cross your legs for long periods of time.  Avoid cat litter boxes and soil used by cats. These carry germs that can cause birth defects in the baby and possibly loss of the fetus by miscarriage or stillbirth.  Avoid all smoking, herbs, alcohol, and unprescribed drugs. Chemicals in these products can affect the formation and growth of the baby.  Do not use any products that contain nicotine or tobacco, such as cigarettes and e-cigarettes. If you need help quitting,  ask your health care provider.  Visit your dentist if you have not gone yet during your pregnancy. Use a soft toothbrush to brush your teeth and be gentle when you floss. Contact a health care provider if:  You have dizziness.  You have mild pelvic cramps, pelvic pressure, or nagging pain in the abdominal area.  You have persistent nausea, vomiting, or diarrhea.  You have a bad smelling vaginal discharge.  You have pain when you urinate. Get help right away if:  You have a fever.  You are leaking fluid from your vagina.  You have spotting or bleeding from your vagina.  You have severe abdominal cramping or pain.  You have rapid weight gain or weight loss.  You have shortness of breath with chest pain.  You notice sudden or extreme swelling of your face, hands, ankles, feet, or legs.  You have not felt your baby move in over an hour.  You have severe headaches that do not go away when you take medicine.  You have vision changes. Summary  The second trimester is from week 14 through week 27 (months 4 through 6). It is also a time when the fetus is growing rapidly.  Your body goes through many changes during pregnancy. The changes vary from woman to woman.  Avoid all smoking, herbs, alcohol, and unprescribed drugs. These chemicals affect the formation and growth your baby.  Do not use any tobacco products, such as cigarettes, chewing tobacco, and e-cigarettes. If you need help quitting, ask your  health care provider.  Contact your health care provider if you have any questions. Keep all prenatal visits as told by your health care provider. This is important. This information is not intended to replace advice given to you by your health care provider. Make sure you discuss any questions you have with your health care provider. Document Released: 05/08/2001 Document Revised: 10/20/2015 Document Reviewed: 07/15/2012 Elsevier Interactive Patient Education  2017 [employer].

## 2020-09-25 DIAGNOSIS — Z419 Encounter for procedure for purposes other than remedying health state, unspecified: Secondary | ICD-10-CM | POA: Diagnosis not present

## 2020-10-05 ENCOUNTER — Other Ambulatory Visit: Payer: Self-pay

## 2020-10-05 ENCOUNTER — Encounter: Payer: Self-pay | Admitting: Women's Health

## 2020-10-05 ENCOUNTER — Ambulatory Visit (INDEPENDENT_AMBULATORY_CARE_PROVIDER_SITE_OTHER): Payer: Medicaid Other | Admitting: Women's Health

## 2020-10-05 VITALS — BP 125/74 | HR 88 | Wt 188.0 lb

## 2020-10-05 DIAGNOSIS — O4440 Low lying placenta NOS or without hemorrhage, unspecified trimester: Secondary | ICD-10-CM

## 2020-10-05 DIAGNOSIS — Z3482 Encounter for supervision of other normal pregnancy, second trimester: Secondary | ICD-10-CM

## 2020-10-05 DIAGNOSIS — Z3480 Encounter for supervision of other normal pregnancy, unspecified trimester: Secondary | ICD-10-CM

## 2020-10-05 NOTE — Patient Instructions (Signed)
Tina Shepard, I greatly value your feedback.  If you receive a survey following your visit with [place] today, we appreciate you taking the time to fill it out.  Thanks, Tina Shepard, CNM, WHNP-BC   You will have your sugar test next visit.  Please do not eat or drink anything after midnight the night before you come, not even water.  You will be here for at least two hours.  Please make an appointment online for the bloodwork at [email or web address] for 8:30am (or as close to this as possible). Make sure you select the [facility] service center. The day of the appointment, check in with our office first, then you will go to [facility] to start the sugar test.    [facility] [place], [place] [address]) Entrance C, located off of [address] valet parking  Go to [email or web address] to register for FREE online childbirth classes   Call the office [phone]) or go to [facility] if:  You begin to have strong, frequent contractions  Your water breaks.  Sometimes it is a big gush of fluid, sometimes it is just a trickle that keeps getting your panties wet or running down your legs  You have vaginal bleeding.  It is normal to have a small amount of spotting if your cervix was checked.   You don't feel your baby moving like normal.  If you don't, get you something to eat and drink and lay down and focus on feeling your baby move.   If your baby is still not moving like normal, you should call the office or go to [facility].  [place] Pediatricians/Family Doctors:  [facility] [phone]            [facility] [phone]                 [facility] [phone] (usually not accepting new patients unless you have family there already, you are always welcome to call and ask)       [facility] Department [phone]       [facility] Pediatricians/Family Doctors:   [facility]: [phone]  [facility]: [phone]  [facility]: [phone]  [facility] Doctors:   [facility]: [phone]   [facility]: [phone]  [facility] Doctors:  [facility]: [phone]   Home Blood Pressure Monitoring for Patients   Your provider has recommended that you check your blood pressure (BP) at least once a week at home. If you do not have a blood pressure cuff at home, one will be provided for you. Contact your provider if you have not received your monitor within 1 week.   Helpful Tips for Accurate Home Blood Pressure Checks  . Don't smoke, exercise, or drink caffeine 30 minutes before checking your BP . Use the restroom before checking your BP (a full bladder can raise your pressure) . Relax in a comfortable upright chair . Feet on the ground . Left arm resting comfortably on a flat surface at the level of your heart . Legs uncrossed . Back supported . Sit quietly and don't talk . Place the cuff on your bare arm . Adjust snuggly, so that only two fingertips can fit between your skin and the top of the cuff . Check 2 readings separated by at least one minute . Keep a log of your BP readings . For a visual, please  reference this diagram: http://ccnc.care/bpdiagram  Provider Name: [facility]     Phone: [phone]  Zone 1: ALL CLEAR  Continue to monitor your symptoms:  . BP reading is less than 140 (top number) or less than 90 (bottom number)  . No right upper stomach pain . No headaches or seeing spots . No feeling nauseated or throwing up . No swelling in face and hands  Zone 2: CAUTION Call your doctor's office for any of the following:  . BP reading is greater than 140 (top number) or greater than 90 (bottom number)  . Stomach pain under your ribs in the middle or right side . Headaches or seeing spots . Feeling nauseated or throwing  up . Swelling in face and hands  Zone 3: EMERGENCY  Seek immediate medical care if you have any of the following:  . BP reading is greater than160 (top number) or greater than 110 (bottom number) . Severe headaches not improving with Tylenol . Serious difficulty catching your breath . Any worsening symptoms from Zone 2   Second Trimester of Pregnancy The second trimester is from week 13 through week 28, months 4 through 6. The second trimester is often a time when you feel your best. Your body has also adjusted to being pregnant, and you begin to feel better physically. Usually, morning sickness has lessened or quit completely, you may have more energy, and you may have an increase in appetite. The second trimester is also a time when the fetus is growing rapidly. At the end of the sixth month, the fetus is about 9 inches long and weighs about 1 pounds. You will likely begin to feel the baby move (quickening) between 18 and 20 weeks of the pregnancy. BODY CHANGES Your body goes through many changes during pregnancy. The changes vary from woman to woman.   Your weight will continue to increase. You will notice your lower abdomen bulging out.  You may begin to get stretch marks on your hips, abdomen, and breasts.  You may develop headaches that can be relieved by medicines approved by your health care provider.  You may urinate more often because the fetus is pressing on your bladder.  You may develop or continue to have heartburn as a result of your pregnancy.  You may develop constipation because certain hormones are causing the muscles that push waste through your intestines to slow down.  You may develop hemorrhoids or swollen, bulging veins (varicose veins).  You may have back pain because of the weight gain and pregnancy hormones relaxing your joints between the bones in your pelvis and as a result of a shift in weight and the muscles that support your balance.  Your breasts will  continue to grow and be tender.  Your gums may bleed and may be sensitive to brushing and flossing.  Dark spots or blotches (chloasma, mask of pregnancy) may develop on your face. This will likely fade after the baby is born.  A dark line from your belly button to the pubic area (linea nigra) may appear. This will likely fade after the baby is born.  You may have changes in your hair. These can include thickening of your hair, rapid growth, and changes in texture. Some women also have hair loss during or after pregnancy, or hair that feels dry or thin. Your hair will most likely return to normal after your baby is born. WHAT TO EXPECT AT YOUR PRENATAL VISITS During a routine prenatal visit:  You  will be weighed to make sure you and the fetus are growing normally.  Your blood pressure will be taken.  Your abdomen will be measured to track your baby's growth.  The fetal heartbeat will be listened to.  Any test results from the previous visit will be discussed. Your health care provider may ask you:  How you are feeling.  If you are feeling the baby move.  If you have had any abnormal symptoms, such as leaking fluid, bleeding, severe headaches, or abdominal cramping.  If you have any questions. Other tests that may be performed during your second trimester include:  Blood tests that check for:  Low iron levels (anemia).  Gestational diabetes (between 24 and 28 weeks).  Rh antibodies.  Urine tests to check for infections, diabetes, or protein in the urine.  An ultrasound to confirm the proper growth and development of the baby.  An amniocentesis to check for possible genetic problems.  Fetal screens for spina bifida and Down syndrome. HOME CARE INSTRUCTIONS   Avoid all smoking, herbs, alcohol, and unprescribed drugs. These chemicals affect the formation and growth of the baby.  Follow your health care provider's instructions regarding medicine use. There are medicines  that are either safe or unsafe to take during pregnancy.  Exercise only as directed by your health care provider. Experiencing uterine cramps is a good sign to stop exercising.  Continue to eat regular, healthy meals.  Wear a good support bra for breast tenderness.  Do not use hot tubs, steam rooms, or saunas.  Wear your seat belt at all times when driving.  Avoid raw meat, uncooked cheese, cat litter boxes, and soil used by cats. These carry germs that can cause birth defects in the baby.  Take your prenatal vitamins.  Try taking a stool softener (if your health care provider approves) if you develop constipation. Eat more high-fiber foods, such as fresh vegetables or fruit and whole grains. Drink plenty of fluids to keep your urine clear or pale yellow.  Take warm sitz baths to soothe any pain or discomfort caused by hemorrhoids. Use hemorrhoid cream if your health care provider approves.  If you develop varicose veins, wear support hose. Elevate your feet for 15 minutes, 3-4 times a day. Limit salt in your diet.  Avoid heavy lifting, wear low heel shoes, and practice good posture.  Rest with your legs elevated if you have leg cramps or low back pain.  Visit your dentist if you have not gone yet during your pregnancy. Use a soft toothbrush to brush your teeth and be gentle when you floss.  A sexual relationship may be continued unless your health care provider directs you otherwise.  Continue to go to all your prenatal visits as directed by your health care provider. SEEK MEDICAL CARE IF:   You have dizziness.  You have mild pelvic cramps, pelvic pressure, or nagging pain in the abdominal area.  You have persistent nausea, vomiting, or diarrhea.  You have a bad smelling vaginal discharge.  You have pain with urination. SEEK IMMEDIATE MEDICAL CARE IF:   You have a fever.  You are leaking fluid from your vagina.  You have spotting or bleeding from your vagina.  You  have severe abdominal cramping or pain.  You have rapid weight gain or loss.  You have shortness of breath with chest pain.  You notice sudden or extreme swelling of your face, hands, ankles, feet, or legs.  You have not felt your baby move  in over an hour.  You have severe headaches that do not go away with medicine.  You have vision changes. Document Released: 05/08/2001 Document Revised: 05/19/2013 Document Reviewed: 07/15/2012 [facility] Patient Information 2015 [place], [place]. This information is not intended to replace advice given to you by your health care provider. Make sure you discuss any questions you have with your health care provider.

## 2020-10-05 NOTE — Progress Notes (Signed)
   LOW-RISK PREGNANCY VISIT Patient name: Tina Shepard MRN [id number]  Date of birth: 1981/05/22 Chief Complaint:   Routine Prenatal Visit  History of Present Illness:   Tina Shepard is a 40 y.o. G21P3003 female at [redacted]w[redacted]d with an Estimated Date of Delivery: 02/08/21 being seen today for ongoing management of a low-risk pregnancy.  Depression screen [facility] 2/9 07/27/2020 06/20/2020  Decreased Interest 0 0  Down, Depressed, Hopeless 0 0  PHQ - 2 Score 0 0  Altered sleeping 0 0  Tired, decreased energy 0 0  Change in appetite 0 0  Feeling bad or failure about yourself  0 0  Trouble concentrating 0 0  Moving slowly or fidgety/restless 0 0  Suicidal thoughts 0 0  PHQ-9 Score 0 0    Today she reports no complaints. Contractions: Not present. Vag. Bleeding: None.  Movement: Present. denies leaking of fluid. Review of Systems:   Pertinent items are noted in HPI Denies abnormal vaginal discharge w/ itching/odor/irritation, headaches, visual changes, shortness of breath, chest pain, abdominal pain, severe nausea/vomiting, or problems with urination or bowel movements unless otherwise stated above. Pertinent History Reviewed:  Reviewed past medical,surgical, social, obstetrical and family history.  Reviewed problem list, medications and allergies. Physical Assessment:   Vitals:   10/05/20 0947  BP: 125/74  Pulse: 88  Weight: 188 lb (85.3 kg)  Body mass index is 36.72 kg/m.        Physical Examination:   General appearance: Well appearing, and in no distress  Mental status: Alert, oriented to person, place, and time  Skin: Warm & dry  Cardiovascular: Normal heart rate noted  Respiratory: Normal respiratory effort, no distress  Abdomen: Soft, gravid, nontender  Pelvic: Cervical exam deferred         Extremities: Edema: None  Fetal Status: Fetal Heart Rate (bpm): 146 Fundal Height: 22 cm Movement: Present    Chaperone: N/A   No results found for this or any previous visit  (from the past 24 hour(s)).  Assessment & Plan:  1) Low-risk pregnancy G4P3003 at [redacted]w[redacted]d with an Estimated Date of Delivery: 02/08/21   2) Low-lying placenta, repeat u/s next visit   Meds: No orders of the defined types were placed in this encounter.  Labs/procedures today: none  Plan:  Continue routine obstetrical care  Next visit: prefers will be in person for pn2, u/s    Reviewed: Preterm labor symptoms and general obstetric precautions including but not limited to vaginal bleeding, contractions, leaking of fluid and fetal movement were reviewed in detail with the patient.  All questions were answered.   Follow-up: Return in about 4 weeks (around 11/02/2020) for LROB, PN2, US:OB F/U placenta , in person, CNM.  No future appointments.  Orders Placed This Encounter  Procedures  . US OB Limited   Cheral Marker CNM, [facility] 10/05/2020 10:13 AM

## 2020-10-26 DIAGNOSIS — Z419 Encounter for procedure for purposes other than remedying health state, unspecified: Secondary | ICD-10-CM | POA: Diagnosis not present

## 2020-11-02 ENCOUNTER — Ambulatory Visit (INDEPENDENT_AMBULATORY_CARE_PROVIDER_SITE_OTHER): Payer: Medicaid Other | Admitting: Advanced Practice Midwife

## 2020-11-02 ENCOUNTER — Other Ambulatory Visit: Payer: Self-pay

## 2020-11-02 ENCOUNTER — Other Ambulatory Visit: Payer: Medicaid Other

## 2020-11-02 ENCOUNTER — Ambulatory Visit (INDEPENDENT_AMBULATORY_CARE_PROVIDER_SITE_OTHER): Payer: Medicaid Other

## 2020-11-02 VITALS — BP 120/70 | HR 84 | Wt 193.0 lb

## 2020-11-02 DIAGNOSIS — Z131 Encounter for screening for diabetes mellitus: Secondary | ICD-10-CM

## 2020-11-02 DIAGNOSIS — Z3A26 26 weeks gestation of pregnancy: Secondary | ICD-10-CM

## 2020-11-02 DIAGNOSIS — Z3480 Encounter for supervision of other normal pregnancy, unspecified trimester: Secondary | ICD-10-CM

## 2020-11-02 DIAGNOSIS — Z3482 Encounter for supervision of other normal pregnancy, second trimester: Secondary | ICD-10-CM

## 2020-11-02 DIAGNOSIS — O4440 Low lying placenta NOS or without hemorrhage, unspecified trimester: Secondary | ICD-10-CM

## 2020-11-02 DIAGNOSIS — O09522 Supervision of elderly multigravida, second trimester: Secondary | ICD-10-CM

## 2020-11-02 NOTE — Progress Notes (Signed)
   LOW-RISK PREGNANCY VISIT Patient name: Tina Shepard MRN [id number]  Date of birth: [birth year] Chief Complaint:   Routine Prenatal Visit and Pregnancy Ultrasound  History of Present Illness:   Tina Shepard is a 40 y.o. G77P3003 female at [redacted]w[redacted]d with an Estimated Date of Delivery: 02/08/21 being seen today for ongoing management of a low-risk pregnancy.  Today she reports doing well. Contractions: Not present. Vag. Bleeding: None.  Movement: Present. denies leaking of fluid. Review of Systems:   Pertinent items are noted in HPI Denies abnormal vaginal discharge w/ itching/odor/irritation, headaches, visual changes, shortness of breath, chest pain, abdominal pain, severe nausea/vomiting, or problems with urination or bowel movements unless otherwise stated above. Pertinent History Reviewed:  Reviewed past medical,surgical, social, obstetrical and family history.  Reviewed problem list, medications and allergies. Physical Assessment:   Vitals:   11/02/20 1111  BP: 120/70  Pulse: 84  Weight: 193 lb (87.5 kg)  Body mass index is 37.69 kg/m.        Physical Examination:   General appearance: Well appearing, and in no distress  Mental status: Alert, oriented to person, place, and time  Skin: Warm & dry  Cardiovascular: Normal heart rate noted  Respiratory: Normal respiratory effort, no distress  Abdomen: Soft, gravid, nontender  Pelvic: Cervical exam deferred         Extremities: Edema: None  Fetal Status: Fetal Heart Rate (bpm): 154 u/s   Movement: Present     F/U scan: LIMITED US 26 wks,breech,posterior placenta gr 0,tip of placenta to cervix 2.8 cm,cervical length 4.4 cm,AFI 14 cm,FHR 154 BPM  No results found for this or any previous visit (from the past 24 hour(s)).  Assessment & Plan:  1) Low-risk pregnancy G4P3003 at [redacted]w[redacted]d with an Estimated Date of Delivery: 02/08/21   2) Previous low lying placenta, resolved today, cx 2.8cm from os   Meds: No orders of the  defined types were placed in this encounter.  Labs/procedures today: u/s; PN2  Plan:  Continue routine obstetrical care   Reviewed: Preterm labor symptoms and general obstetric precautions including but not limited to vaginal bleeding, contractions, leaking of fluid and fetal movement were reviewed in detail with the patient.  All questions were answered. Has home bp cuff. Check bp weekly, let us know if >140/90.   Follow-up: Return in about 3 weeks (around 11/23/2020) for LROB, in person.  No orders of the defined types were placed in this encounter.  Arabella Merles CNM 11/02/2020 11:29 AM

## 2020-11-02 NOTE — Patient Instructions (Signed)
Tina Shepard, thank you for choosing our office today! We appreciate the opportunity to meet your healthcare needs. You may receive a short survey by mail, e-mail, or through [employer]. If you are happy with your care we would appreciate if you could take just a few minutes to complete the survey questions. We read all of your comments and take your feedback very seriously. Thank you again for choosing our office.  Center for [employer] Team at [facility] at [facility] ([address] [place], [place] [address]) Entrance C, located off of [address] Free 24/7 valet parking  Go to [email or web address] to register for FREE online childbirth classes  Call the office [phone]) or go to [facility] if:  You begin to severe cramping  Your water breaks.  Sometimes it is a big gush of fluid, sometimes it is just a trickle that keeps getting your panties wet or running down your legs  You have vaginal bleeding.  It is normal to have a small amount of spotting if your cervix was checked.   [facility] Pediatricians/Family Doctors  [facility]): [address] Colette Ribas, [phone]            [facility]: [address], [phone]                 [facility]): [address], [phone] (call to ask if accepting patients)  [facility] Department: [address], [place], [phone]    [facility] Pediatricians/Family Doctors  [facility]): [phone] [address], [phone]  [facility]: [address] [place], [phone]  [facility]: [address], [phone]  [facility] Doctors   [facility]): [phone]  [facility]: [address], [phone]   [facility] Doctors  [facility]: [address], [phone]  [facility] Doctors  . [facility]: [address], [phone]  Home Blood Pressure Monitoring for Patients   Your provider has recommended that you check your blood pressure (BP) at least once a week at home. If you do not have a blood pressure cuff at home, one will be provided for you. Contact your provider if you have not received your monitor within 1 week.   Helpful Tips for Accurate Home Blood Pressure Checks  . Don't smoke, exercise, or drink caffeine 30 minutes before checking your BP . Use the restroom before checking your BP (a full bladder can raise your pressure) . Relax in a comfortable upright chair . Feet on the ground . Left arm resting comfortably on a flat surface at the level of your heart . Legs uncrossed . Back supported . Sit quietly and don't talk . Place the cuff on your bare arm . Adjust snuggly, so that only two fingertips can fit between your skin and the top of the cuff . Check 2 readings separated by at least one minute . Keep a log of your BP readings . For a visual, please reference this diagram: http://ccnc.care/bpdiagram  Provider Name: [facility]     Phone: [phone]  Zone 1: ALL CLEAR  Continue to monitor your symptoms:  . BP reading is less than 140 (top number) or less than 90 (bottom number)  . No right upper stomach pain . No headaches or seeing spots . No feeling nauseated or throwing up .  No swelling in face and hands  Zone 2: CAUTION Call your doctor's office for any of the following:  . BP reading is greater than 140 (top number) or greater than 90 (bottom number)  . Stomach pain under your ribs in the middle or right side . Headaches or seeing spots . Feeling nauseated or throwing up . Swelling in face and hands  Zone 3: EMERGENCY  Seek immediate medical care if you have any of the following:  . BP reading is greater than160 (top number) or greater than 110 (bottom number) . Severe headaches not improving  with Tylenol . Serious difficulty catching your breath . Any worsening symptoms from Zone 2     Second Trimester of Pregnancy The second trimester is from week 14 through week 27 (months 4 through 6). The second trimester is often a time when you feel your best. Your body has adjusted to being pregnant, and you begin to feel better physically. Usually, morning sickness has lessened or quit completely, you may have more energy, and you may have an increase in appetite. The second trimester is also a time when the fetus is growing rapidly. At the end of the sixth month, the fetus is about 9 inches long and weighs about 1 pounds. You will likely begin to feel the baby move (quickening) between 16 and 20 weeks of pregnancy. Body changes during your second trimester Your body continues to go through many changes during your second trimester. The changes vary from woman to woman.  Your weight will continue to increase. You will notice your lower abdomen bulging out.  You may begin to get stretch marks on your hips, abdomen, and breasts.  You may develop headaches that can be relieved by medicines. The medicines should be approved by your health care provider.  You may urinate more often because the fetus is pressing on your bladder.  You may develop or continue to have heartburn as a result of your pregnancy.  You may develop constipation because certain hormones are causing the muscles that push waste through your intestines to slow down.  You may develop hemorrhoids or swollen, bulging veins (varicose veins).  You may have back pain. This is caused by: ? Weight gain. ? Pregnancy hormones that are relaxing the joints in your pelvis. ? A shift in weight and the muscles that support your balance.  Your breasts will continue to grow and they will continue to become tender.  Your gums may bleed and may be sensitive to brushing and flossing.  Dark spots or blotches (chloasma, mask of  pregnancy) may develop on your face. This will likely fade after the baby is born.  A dark line from your belly button to the pubic area (linea nigra) may appear. This will likely fade after the baby is born.  You may have changes in your hair. These can include thickening of your hair, rapid growth, and changes in texture. Some women also have hair loss during or after pregnancy, or hair that feels dry or thin. Your hair will most likely return to normal after your baby is born.  What to expect at prenatal visits During a routine prenatal visit:  You will be weighed to make sure you and the fetus are growing normally.  Your blood pressure will be taken.  Your abdomen will be measured to track your baby's growth.  The fetal heartbeat will be listened to.  Any test results from the previous visit will be discussed.  Your  health care provider may ask you:  How you are feeling.  If you are feeling the baby move.  If you have had any abnormal symptoms, such as leaking fluid, bleeding, severe headaches, or abdominal cramping.  If you are using any tobacco products, including cigarettes, chewing tobacco, and electronic cigarettes.  If you have any questions.  Other tests that may be performed during your second trimester include:  Blood tests that check for: ? Low iron levels (anemia). ? High blood sugar that affects pregnant women (gestational diabetes) between 28 and 28 weeks. ? Rh antibodies. This is to check for a protein on red blood cells (Rh factor).  Urine tests to check for infections, diabetes, or protein in the urine.  An ultrasound to confirm the proper growth and development of the baby.  An amniocentesis to check for possible genetic problems.  Fetal screens for spina bifida and Down syndrome.  HIV (human immunodeficiency virus) testing. Routine prenatal testing includes screening for HIV, unless you choose not to have this test.  Follow these instructions at  home: Medicines  Follow your health care provider's instructions regarding medicine use. Specific medicines may be either safe or unsafe to take during pregnancy.  Take a prenatal vitamin that contains at least 600 micrograms (mcg) of folic acid.  If you develop constipation, try taking a stool softener if your health care provider approves. Eating and drinking  Eat a balanced diet that includes fresh fruits and vegetables, whole grains, good sources of protein such as meat, eggs, or tofu, and low-fat dairy. Your health care provider will help you determine the amount of weight gain that is right for you.  Avoid raw meat and uncooked cheese. These carry germs that can cause birth defects in the baby.  If you have low calcium intake from food, talk to your health care provider about whether you should take a daily calcium supplement.  Limit foods that are high in fat and processed sugars, such as fried and sweet foods.  To prevent constipation: ? Drink enough fluid to keep your urine clear or pale yellow. ? Eat foods that are high in fiber, such as fresh fruits and vegetables, whole grains, and beans. Activity  Exercise only as directed by your health care provider. Most women can continue their usual exercise routine during pregnancy. Try to exercise for 30 minutes at least 5 days a week. Stop exercising if you experience uterine contractions.  Avoid heavy lifting, wear low heel shoes, and practice good posture.  A sexual relationship may be continued unless your health care provider directs you otherwise. Relieving pain and discomfort  Wear a good support bra to prevent discomfort from breast tenderness.  Take warm sitz baths to soothe any pain or discomfort caused by hemorrhoids. Use hemorrhoid cream if your health care provider approves.  Rest with your legs elevated if you have leg cramps or low back pain.  If you develop varicose veins, wear support hose. Elevate your feet  for 15 minutes, 3-4 times a day. Limit salt in your diet. Prenatal Care  Write down your questions. Take them to your prenatal visits.  Keep all your prenatal visits as told by your health care provider. This is important. Safety  Wear your seat belt at all times when driving.  Make a list of emergency phone numbers, including numbers for family, friends, the hospital, and police and fire departments. General instructions  Ask your health care provider for a referral to a local prenatal education  class. Begin classes no later than the beginning of month 6 of your pregnancy.  Ask for help if you have counseling or nutritional needs during pregnancy. Your health care provider can offer advice or refer you to specialists for help with various needs.  Do not use hot tubs, steam rooms, or saunas.  Do not douche or use tampons or scented sanitary pads.  Do not cross your legs for long periods of time.  Avoid cat litter boxes and soil used by cats. These carry germs that can cause birth defects in the baby and possibly loss of the fetus by miscarriage or stillbirth.  Avoid all smoking, herbs, alcohol, and unprescribed drugs. Chemicals in these products can affect the formation and growth of the baby.  Do not use any products that contain nicotine or tobacco, such as cigarettes and e-cigarettes. If you need help quitting, ask your health care provider.  Visit your dentist if you have not gone yet during your pregnancy. Use a soft toothbrush to brush your teeth and be gentle when you floss. Contact a health care provider if:  You have dizziness.  You have mild pelvic cramps, pelvic pressure, or nagging pain in the abdominal area.  You have persistent nausea, vomiting, or diarrhea.  You have a bad smelling vaginal discharge.  You have pain when you urinate. Get help right away if:  You have a fever.  You are leaking fluid from your vagina.  You have spotting or bleeding from your  vagina.  You have severe abdominal cramping or pain.  You have rapid weight gain or weight loss.  You have shortness of breath with chest pain.  You notice sudden or extreme swelling of your face, hands, ankles, feet, or legs.  You have not felt your baby move in over an hour.  You have severe headaches that do not go away when you take medicine.  You have vision changes. Summary  The second trimester is from week 14 through week 27 (months 4 through 6). It is also a time when the fetus is growing rapidly.  Your body goes through many changes during pregnancy. The changes vary from woman to woman.  Avoid all smoking, herbs, alcohol, and unprescribed drugs. These chemicals affect the formation and growth your baby.  Do not use any tobacco products, such as cigarettes, chewing tobacco, and e-cigarettes. If you need help quitting, ask your health care provider.  Contact your health care provider if you have any questions. Keep all prenatal visits as told by your health care provider. This is important. This information is not intended to replace advice given to you by your health care provider. Make sure you discuss any questions you have with your health care provider. Document Released: 05/08/2001 Document Revised: 10/20/2015 Document Reviewed: 07/15/2012 Elsevier Interactive Patient Education  2017 [employer].

## 2020-11-02 NOTE — Progress Notes (Signed)
LIMITED US 26 wks,breech,posterior placenta gr 0,tip of placenta to cervix 2.8 cm,cervical length 4.4 cm,AFI 14 cm,FHR 154 BPM

## 2020-11-03 ENCOUNTER — Other Ambulatory Visit: Payer: Medicaid Other

## 2020-11-03 ENCOUNTER — Encounter: Payer: Medicaid Other | Admitting: Women's Health

## 2020-11-03 LAB — RPR: RPR Ser Ql: NONREACTIVE

## 2020-11-03 LAB — CBC
Hematocrit: 38.4 % (ref 34.0–46.6)
Hemoglobin: 13 g/dL (ref 11.1–15.9)
MCH: 31.2 pg (ref 26.6–33.0)
MCHC: 33.9 g/dL (ref 31.5–35.7)
MCV: 92 fL (ref 79–97)
Platelets: 269 10*3/uL (ref 150–450)
RBC: 4.17 x10E6/uL (ref 3.77–5.28)
RDW: 13 % (ref 11.7–15.4)
WBC: 9.3 10*3/uL (ref 3.4–10.8)

## 2020-11-03 LAB — GLUCOSE TOLERANCE, 2 HOURS W/ 1HR
Glucose, 1 hour: 177 mg/dL (ref 65–179)
Glucose, 2 hour: 119 mg/dL (ref 65–152)
Glucose, Fasting: 82 mg/dL (ref 65–91)

## 2020-11-03 LAB — HIV ANTIBODY (ROUTINE TESTING W REFLEX): HIV Screen 4th Generation wRfx: NONREACTIVE

## 2020-11-03 LAB — ANTIBODY SCREEN: Antibody Screen: NEGATIVE

## 2020-11-23 ENCOUNTER — Other Ambulatory Visit: Payer: Self-pay

## 2020-11-23 ENCOUNTER — Ambulatory Visit (INDEPENDENT_AMBULATORY_CARE_PROVIDER_SITE_OTHER): Payer: Medicaid Other | Admitting: Advanced Practice Midwife

## 2020-11-23 VITALS — BP 119/81 | HR 95 | Wt 193.0 lb

## 2020-11-23 DIAGNOSIS — Z23 Encounter for immunization: Secondary | ICD-10-CM

## 2020-11-23 DIAGNOSIS — Z3A29 29 weeks gestation of pregnancy: Secondary | ICD-10-CM

## 2020-11-23 DIAGNOSIS — Z3480 Encounter for supervision of other normal pregnancy, unspecified trimester: Secondary | ICD-10-CM

## 2020-11-23 NOTE — Patient Instructions (Signed)
Tina Shepard, thank you for choosing our office today! We appreciate the opportunity to meet your healthcare needs. You may receive a short survey by mail, e-mail, or through [employer]. If you are happy with your care we would appreciate if you could take just a few minutes to complete the survey questions. We read all of your comments and take your feedback very seriously. Thank you again for choosing our office.  Center for [employer] Team at [facility]  [facility] ([address] [place], [place] [address]) Entrance C, located off of [address] Free 24/7 valet parking   CLASSES: Go to [email or web address] to register for classes (childbirth, breastfeeding, waterbirth, infant CPR, daddy bootcamp, etc.)  Call the office [phone]) or go to [facility] if: You begin to have strong, frequent contractions Your water breaks.  Sometimes it is a big gush of fluid, sometimes it is just a trickle that keeps getting your panties wet or running down your legs You have vaginal bleeding.  It is normal to have a small amount of spotting if your cervix was checked.  You don't feel your baby moving like normal.  If you don't, get you something to eat and drink and lay down and focus on feeling your baby move.   If your baby is still not moving like normal, you should call the office or go to [facility].  Call the office [phone]) or go to [facility] hospital for these signs of pre-eclampsia: Severe headache that does not go away with Tylenol Visual changes- seeing spots, double, blurred vision Pain under your right breast or upper abdomen that does not go away with Tums or heartburn medicine Nausea and/or vomiting Severe swelling in your hands, feet, and face   Tdap Vaccine It is recommended that you get the Tdap vaccine during the third trimester of EACH pregnancy to help protect your baby from getting pertussis (whooping cough) 27-36 weeks is the BEST time to do  this so that you can pass the protection on to your baby. During pregnancy is better than after pregnancy, but if you are unable to get it during pregnancy it will be offered at the hospital.  You can get this vaccine with [place], at the health department, your family doctor, or some local pharmacies Everyone who will be around your baby should also be up-to-date on their vaccines before the baby comes. Adults (who are not pregnant) only need 1 dose of Tdap during adulthood.   [facility] Pediatricians/Family Doctors [facility]): [address] Colette Ribas, [phone]           [facility]: [address], [phone]                [facility]): [address], [phone] (call to ask if accepting patients) [facility]: [address], [place], [phone]    [facility] Pediatricians/Family Doctors [facility]): [phone], [phone] [facility]: [address] [place], [phone] [facility]: [address], [phone]  [facility] Doctors  [facility]): [phone] [facility]: [address], [phone]   [facility] Doctors [facility]: [address] [phone]  [facility] Family Doctors  [facility]: [phone], [phone]  Home Blood Pressure Monitoring for Patients   Your provider has recommended that you check your  blood pressure (BP) at least once a week at home. If you do not have a blood pressure cuff at home, one will be provided for you. Contact your provider if you have not received your monitor within 1 week.   Helpful Tips for Accurate Home Blood Pressure Checks  Don't smoke, exercise, or drink caffeine 30 minutes before checking your BP Use the restroom before checking your BP (a full bladder can raise your  pressure) Relax in a comfortable upright chair Feet on the ground Left arm resting comfortably on a flat surface at the level of your heart Legs uncrossed Back supported Sit quietly and don't talk Place the cuff on your bare arm Adjust snuggly, so that only two fingertips can fit between your skin and the top of the cuff Check 2 readings separated by at least one minute Keep a log of your BP readings For a visual, please reference this diagram: http://ccnc.care/bpdiagram  Provider Name: [facility]     Phone: [phone]  Zone 1: ALL CLEAR  Continue to monitor your symptoms:  BP reading is less than 140 (top number) or less than 90 (bottom number)  No right upper stomach pain No headaches or seeing spots No feeling nauseated or throwing up No swelling in face and hands  Zone 2: CAUTION Call your doctor's office for any of the following:  BP reading is greater than 140 (top number) or greater than 90 (bottom number)  Stomach pain under your ribs in the middle or right side Headaches or seeing spots Feeling nauseated or throwing up Swelling in face and hands  Zone 3: EMERGENCY  Seek immediate medical care if you have any of the following:  BP reading is greater than160 (top number) or greater than 110 (bottom number) Severe headaches not improving with Tylenol Serious difficulty catching your breath Any worsening symptoms from Zone 2   Third Trimester of Pregnancy The third trimester is from week 29 through week 42, months 7 through 9. The third trimester is a time when the fetus is growing rapidly. At the end of the ninth month, the fetus is about 20 inches in length and weighs 6-10 pounds.  BODY CHANGES Your body goes through many changes during pregnancy. The changes vary from woman to woman.  Your weight will continue to increase. You can expect to gain 25-35 pounds (11-16 kg) by the end of the pregnancy. You may begin to get stretch marks on your hips, abdomen,  and breasts. You may urinate more often because the fetus is moving lower into your pelvis and pressing on your bladder. You may develop or continue to have heartburn as a result of your pregnancy. You may develop constipation because certain hormones are causing the muscles that push waste through your intestines to slow down. You may develop hemorrhoids or swollen, bulging veins (varicose veins). You may have pelvic pain because of the weight gain and pregnancy hormones relaxing your joints between the bones in your pelvis. Backaches may result from overexertion of the muscles supporting your posture. You may have changes in your hair. These can include thickening of your hair, rapid growth, and changes in texture. Some women also have hair loss during or after pregnancy, or hair that feels dry or thin. Your hair will most likely return to normal after your baby is born. Your breasts will continue to grow and be tender. A yellow discharge may leak from your breasts called colostrum. Your belly button may stick out. You may   feel short of breath because of your expanding uterus. You may notice the fetus "dropping," or moving lower in your abdomen. You may have a bloody mucus discharge. This usually occurs a few days to a week before labor begins. Your cervix becomes thin and soft (effaced) near your due date. WHAT TO EXPECT AT YOUR PRENATAL EXAMS  You will have prenatal exams every 2 weeks until week 36. Then, you will have weekly prenatal exams. During a routine prenatal visit: You will be weighed to make sure you and the fetus are growing normally. Your blood pressure is taken. Your abdomen will be measured to track your baby's growth. The fetal heartbeat will be listened to. Any test results from the previous visit will be discussed. You may have a cervical check near your due date to see if you have effaced. At around 36 weeks, your caregiver will check your cervix. At the same time, your  caregiver will also perform a test on the secretions of the vaginal tissue. This test is to determine if a type of bacteria, Group B streptococcus, is present. Your caregiver will explain this further. Your caregiver may ask you: What your birth plan is. How you are feeling. If you are feeling the baby move. If you have had any abnormal symptoms, such as leaking fluid, bleeding, severe headaches, or abdominal cramping. If you have any questions. Other tests or screenings that may be performed during your third trimester include: Blood tests that check for low iron levels (anemia). Fetal testing to check the health, activity level, and growth of the fetus. Testing is done if you have certain medical conditions or if there are problems during the pregnancy. FALSE LABOR You may feel small, irregular contractions that eventually go away. These are called Braxton Hicks contractions, or false labor. Contractions may last for hours, days, or even weeks before true labor sets in. If contractions come at regular intervals, intensify, or become painful, it is best to be seen by your caregiver.  SIGNS OF LABOR  Menstrual-like cramps. Contractions that are 5 minutes apart or less. Contractions that start on the top of the uterus and spread down to the lower abdomen and back. A sense of increased pelvic pressure or back pain. A watery or bloody mucus discharge that comes from the vagina. If you have any of these signs before the 37th week of pregnancy, call your caregiver right away. You need to go to the hospital to get checked immediately. HOME CARE INSTRUCTIONS  Avoid all smoking, herbs, alcohol, and unprescribed drugs. These chemicals affect the formation and growth of the baby. Follow your caregiver's instructions regarding medicine use. There are medicines that are either safe or unsafe to take during pregnancy. Exercise only as directed by your caregiver. Experiencing uterine cramps is a good sign to  stop exercising. Continue to eat regular, healthy meals. Wear a good support bra for breast tenderness. Do not use hot tubs, steam rooms, or saunas. Wear your seat belt at all times when driving. Avoid raw meat, uncooked cheese, cat litter boxes, and soil used by cats. These carry germs that can cause birth defects in the baby. Take your prenatal vitamins. Try taking a stool softener (if your caregiver approves) if you develop constipation. Eat more high-fiber foods, such as fresh vegetables or fruit and whole grains. Drink plenty of fluids to keep your urine clear or pale yellow. Take warm sitz baths to soothe any pain or discomfort caused by hemorrhoids. Use hemorrhoid cream if   your caregiver approves. If you develop varicose veins, wear support hose. Elevate your feet for 15 minutes, 3-4 times a day. Limit salt in your diet. Avoid heavy lifting, wear low heal shoes, and practice good posture. Rest a lot with your legs elevated if you have leg cramps or low back pain. Visit your dentist if you have not gone during your pregnancy. Use a soft toothbrush to brush your teeth and be gentle when you floss. A sexual relationship may be continued unless your caregiver directs you otherwise. Do not travel far distances unless it is absolutely necessary and only with the approval of your caregiver. Take prenatal classes to understand, practice, and ask questions about the labor and delivery. Make a trial run to the hospital. Pack your hospital bag. Prepare the baby's nursery. Continue to go to all your prenatal visits as directed by your caregiver. SEEK MEDICAL CARE IF: You are unsure if you are in labor or if your water has broken. You have dizziness. You have mild pelvic cramps, pelvic pressure, or nagging pain in your abdominal area. You have persistent nausea, vomiting, or diarrhea. You have a bad smelling vaginal discharge. You have pain with urination. SEEK IMMEDIATE MEDICAL CARE IF:  You  have a fever. You are leaking fluid from your vagina. You have spotting or bleeding from your vagina. You have severe abdominal cramping or pain. You have rapid weight loss or gain. You have shortness of breath with chest pain. You notice sudden or extreme swelling of your face, hands, ankles, feet, or legs. You have not felt your baby move in over an hour. You have severe headaches that do not go away with medicine. You have vision changes. Document Released: 05/08/2001 Document Revised: 05/19/2013 Document Reviewed: 07/15/2012 [facility] Patient Information 2015 [place], [place]. This information is not intended to replace advice given to you by your health care provider. Make sure you discuss any questions you have with your health care provider.

## 2020-11-23 NOTE — Progress Notes (Signed)
   LOW-RISK PREGNANCY VISIT Patient name: Tina Shepard MRN [id number]  Date of birth: [birth year] Chief Complaint:   Routine Prenatal Visit  History of Present Illness:   Tina Shepard is a 40 y.o. G65P3003 female at 101w0d with an Estimated Date of Delivery: 02/08/21 being seen today for ongoing management of a low-risk pregnancy.  Today she reports heartburn. Contractions: Not present. Vag. Bleeding: None.  Movement: Present. denies leaking of fluid. Review of Systems:   Pertinent items are noted in HPI Denies abnormal vaginal discharge w/ itching/odor/irritation, headaches, visual changes, shortness of breath, chest pain, abdominal pain, severe nausea/vomiting, or problems with urination or bowel movements unless otherwise stated above. Pertinent History Reviewed:  Reviewed past medical,surgical, social, obstetrical and family history.  Reviewed problem list, medications and allergies. Physical Assessment:   Vitals:   11/23/20 1021  BP: 119/81  Pulse: 95  Weight: 193 lb (87.5 kg)  Body mass index is 37.69 kg/m.        Physical Examination:   General appearance: Well appearing, and in no distress  Mental status: Alert, oriented to person, place, and time  Skin: Warm & dry  Cardiovascular: Normal heart rate noted  Respiratory: Normal respiratory effort, no distress  Abdomen: Soft, gravid, nontender  Pelvic: Cervical exam deferred         Extremities: Edema: None  Fetal Status: Fetal Heart Rate (bpm): 156 Fundal Height: 29 cm Movement: Present    No results found for this or any previous visit (from the past 24 hour(s)).  Assessment & Plan:  1) Low-risk pregnancy G4P3003 at [redacted]w[redacted]d with an Estimated Date of Delivery: 02/08/21   2) Heartburn, drinking milk/honey which helps some; wants to try prn Tums to start with   Meds: No orders of the defined types were placed in this encounter.  Labs/procedures today: Tdap  Plan:  Continue routine obstetrical care   Reviewed:  Preterm labor symptoms and general obstetric precautions including but not limited to vaginal bleeding, contractions, leaking of fluid and fetal movement were reviewed in detail with the patient.  All questions were answered. Has home bp cuff. Check bp weekly, let us know if >140/90.   Follow-up: Return in about 2 weeks (around 12/07/2020) for LROB, in person.  Orders Placed This Encounter  Procedures   Tdap vaccine greater than or equal to 7yo IM   Arabella Merles [facility] 11/23/2020 10:42 AM

## 2020-11-25 DIAGNOSIS — Z419 Encounter for procedure for purposes other than remedying health state, unspecified: Secondary | ICD-10-CM | POA: Diagnosis not present

## 2020-12-07 ENCOUNTER — Other Ambulatory Visit: Payer: Self-pay

## 2020-12-07 ENCOUNTER — Ambulatory Visit (INDEPENDENT_AMBULATORY_CARE_PROVIDER_SITE_OTHER): Payer: Medicaid Other | Admitting: Advanced Practice Midwife

## 2020-12-07 VITALS — BP 117/74 | HR 90 | Wt 194.0 lb

## 2020-12-07 DIAGNOSIS — Z3A31 31 weeks gestation of pregnancy: Secondary | ICD-10-CM

## 2020-12-07 DIAGNOSIS — Z3480 Encounter for supervision of other normal pregnancy, unspecified trimester: Secondary | ICD-10-CM

## 2020-12-07 NOTE — Patient Instructions (Signed)
[place], thank you for choosing our office today! We appreciate the opportunity to meet your healthcare needs. You may receive a short survey by mail, e-mail, or through MyChart. If you are happy with your care we would appreciate if you could take just a few minutes to complete the survey questions. We read all of your comments and take your feedback very seriously. Thank you again for choosing our office.  [facility] ([address] [place], [place] [address]) Entrance C, located off of [address] Free 24/7 valet parking   CLASSES: Go to [email or web address] to register for classes (childbirth, breastfeeding, waterbirth, infant CPR, daddy bootcamp, etc.)  Call the office ([phone]) or go to [facility] if: You begin to have strong, frequent contractions Your water breaks.  Sometimes it is a big gush of fluid, sometimes it is just a trickle that keeps getting your panties wet or running down your legs You have vaginal bleeding.  It is normal to have a small amount of spotting if your cervix was checked.  You don't feel your baby moving like normal.  If you don't, get you something to eat and drink and lay down and focus on feeling your baby move.   If your baby is still not moving like normal, you should call the office or go to [facility].  Call the office ([phone]) or go to [facility] for these signs of pre-eclampsia: Severe headache that does not go away with Tylenol Visual changes- seeing spots, double, blurred vision Pain under your right breast or upper abdomen that does not go away with Tums or heartburn medicine Nausea and/or vomiting Severe swelling in your hands, feet, and face   Tdap Vaccine It is recommended that you get the Tdap vaccine during the third trimester of EACH pregnancy to help protect your baby from getting pertussis (whooping cough) 27-36 weeks is the BEST time to do  this so that you can pass the protection on to your baby. During pregnancy is better than after pregnancy, but if you are unable to get it during pregnancy it will be offered at the hospital.  You can get this vaccine with us, at the health department, your family doctor, or some local pharmacies Everyone who will be around your baby should also be up-to-date on their vaccines before the baby comes. Adults (who are not pregnant) only need 1 dose of Tdap during adulthood.   [place] Pediatricians/Family Doctors [facility] ([facility]): [address], [phone]           [facility]: [address], [phone]                [facility] ([facility]): [address], [phone] (call to ask if accepting patients) [facility]: [address], [place], [phone]    [place] Pediatricians/Family Doctors [facility] ([facility]): [address], [phone] [facility]: [address], [phone] [facility]: [address], [phone]  [place] [facility]  [facility] ([facility]): [phone] [facility]: [address], [phone]   [place] [facility]: [address], [phone]  [place] [facility]  [facility]: [phone], [phone]  Home Blood Pressure Monitoring for Patients   Your provider has recommended that you check your   blood pressure (BP) at least once a week at home. If you do not have a blood pressure cuff at home, one will be provided for you. Contact your provider if you have not received your monitor within 1 week.   Helpful Tips for Accurate Home Blood Pressure Checks  Don't smoke, exercise, or drink caffeine 30 minutes before checking your BP Use the restroom before checking your BP (a full bladder can raise your  pressure) Relax in a comfortable upright chair Feet on the ground Left arm resting comfortably on a flat surface at the level of your heart Legs uncrossed Back supported Sit quietly and don't talk Place the cuff on your bare arm Adjust snuggly, so that only two fingertips can fit between your skin and the top of the cuff Check 2 readings separated by at least one minute Keep a log of your BP readings For a visual, please reference this diagram: http://ccnc.care/bpdiagram  Provider Name: [facility]     Phone: [phone]  Zone 1: ALL CLEAR  Continue to monitor your symptoms:  BP reading is less than 140 (top number) or less than 90 (bottom number)  No right upper stomach pain No headaches or seeing spots No feeling nauseated or throwing up No swelling in face and hands  Zone 2: CAUTION Call your doctor's office for any of the following:  BP reading is greater than 140 (top number) or greater than 90 (bottom number)  Stomach pain under your ribs in the middle or right side Headaches or seeing spots Feeling nauseated or throwing up Swelling in face and hands  Zone 3: EMERGENCY  Seek immediate medical care if you have any of the following:  BP reading is greater than160 (top number) or greater than 110 (bottom number) Severe headaches not improving with Tylenol Serious difficulty catching your breath Any worsening symptoms from Zone 2   Third Trimester of Pregnancy The third trimester is from week 29 through week 42, months 7 through 9. The third trimester is a time when the fetus is growing rapidly. At the end of the ninth month, the fetus is about 20 inches in length and weighs 6-10 pounds.  BODY CHANGES Your body goes through many changes during pregnancy. The changes vary from woman to woman.  Your weight will continue to increase. You can expect to gain 25-35 pounds (11-16 kg) by the end of the pregnancy. You may begin to get stretch marks on your hips, abdomen,  and breasts. You may urinate more often because the fetus is moving lower into your pelvis and pressing on your bladder. You may develop or continue to have heartburn as a result of your pregnancy. You may develop constipation because certain hormones are causing the muscles that push waste through your intestines to slow down. You may develop hemorrhoids or swollen, bulging veins (varicose veins). You may have pelvic pain because of the weight gain and pregnancy hormones relaxing your joints between the bones in your pelvis. Backaches may result from overexertion of the muscles supporting your posture. You may have changes in your hair. These can include thickening of your hair, rapid growth, and changes in texture. Some women also have hair loss during or after pregnancy, or hair that feels dry or thin. Your hair will most likely return to normal after your baby is born. Your breasts will continue to grow and be tender. A yellow discharge may leak from your breasts called colostrum. Your belly button may stick out. You may   feel short of breath because of your expanding uterus. You may notice the fetus "dropping," or moving lower in your abdomen. You may have a bloody mucus discharge. This usually occurs a few days to a week before labor begins. Your cervix becomes thin and soft (effaced) near your due date. WHAT TO EXPECT AT YOUR PRENATAL EXAMS  You will have prenatal exams every 2 weeks until week 36. Then, you will have weekly prenatal exams. During a routine prenatal visit: You will be weighed to make sure you and the fetus are growing normally. Your blood pressure is taken. Your abdomen will be measured to track your baby's growth. The fetal heartbeat will be listened to. Any test results from the previous visit will be discussed. You may have a cervical check near your due date to see if you have effaced. At around 36 weeks, your caregiver will check your cervix. At the same time, your  caregiver will also perform a test on the secretions of the vaginal tissue. This test is to determine if a type of bacteria, Group B streptococcus, is present. Your caregiver will explain this further. Your caregiver may ask you: What your birth plan is. How you are feeling. If you are feeling the baby move. If you have had any abnormal symptoms, such as leaking fluid, bleeding, severe headaches, or abdominal cramping. If you have any questions. Other tests or screenings that may be performed during your third trimester include: Blood tests that check for low iron levels (anemia). Fetal testing to check the health, activity level, and growth of the fetus. Testing is done if you have certain medical conditions or if there are problems during the pregnancy. FALSE LABOR You may feel small, irregular contractions that eventually go away. These are called Braxton Hicks contractions, or false labor. Contractions may last for hours, days, or even weeks before true labor sets in. If contractions come at regular intervals, intensify, or become painful, it is best to be seen by your caregiver.  SIGNS OF LABOR  Menstrual-like cramps. Contractions that are 5 minutes apart or less. Contractions that start on the top of the uterus and spread down to the lower abdomen and back. A sense of increased pelvic pressure or back pain. A watery or bloody mucus discharge that comes from the vagina. If you have any of these signs before the 37th week of pregnancy, call your caregiver right away. You need to go to the hospital to get checked immediately. HOME CARE INSTRUCTIONS  Avoid all smoking, herbs, alcohol, and unprescribed drugs. These chemicals affect the formation and growth of the baby. Follow your caregiver's instructions regarding medicine use. There are medicines that are either safe or unsafe to take during pregnancy. Exercise only as directed by your caregiver. Experiencing uterine cramps is a good sign to  stop exercising. Continue to eat regular, healthy meals. Wear a good support bra for breast tenderness. Do not use hot tubs, steam rooms, or saunas. Wear your seat belt at all times when driving. Avoid raw meat, uncooked cheese, cat litter boxes, and soil used by cats. These carry germs that can cause birth defects in the baby. Take your prenatal vitamins. Try taking a stool softener (if your caregiver approves) if you develop constipation. Eat more high-fiber foods, such as fresh vegetables or fruit and whole grains. Drink plenty of fluids to keep your urine clear or pale yellow. Take warm sitz baths to soothe any pain or discomfort caused by hemorrhoids. Use hemorrhoid cream if   your caregiver approves. If you develop varicose veins, wear support hose. Elevate your feet for 15 minutes, 3-4 times a day. Limit salt in your diet. Avoid heavy lifting, wear low heal shoes, and practice good posture. Rest a lot with your legs elevated if you have leg cramps or low back pain. Visit your dentist if you have not gone during your pregnancy. Use a soft toothbrush to brush your teeth and be gentle when you floss. A sexual relationship may be continued unless your caregiver directs you otherwise. Do not travel far distances unless it is absolutely necessary and only with the approval of your caregiver. Take prenatal classes to understand, practice, and ask questions about the labor and delivery. Make a trial run to the hospital. Pack your hospital bag. Prepare the baby's nursery. Continue to go to all your prenatal visits as directed by your caregiver. SEEK MEDICAL CARE IF: You are unsure if you are in labor or if your water has broken. You have dizziness. You have mild pelvic cramps, pelvic pressure, or nagging pain in your abdominal area. You have persistent nausea, vomiting, or diarrhea. You have a bad smelling vaginal discharge. You have pain with urination. SEEK IMMEDIATE MEDICAL CARE IF:  You  have a fever. You are leaking fluid from your vagina. You have spotting or bleeding from your vagina. You have severe abdominal cramping or pain. You have rapid weight loss or gain. You have shortness of breath with chest pain. You notice sudden or extreme swelling of your face, hands, ankles, feet, or legs. You have not felt your baby move in over an hour. You have severe headaches that do not go away with medicine. You have vision changes. Document Released: 05/08/2001 Document Revised: 05/19/2013 Document Reviewed: 07/15/2012 [facility] Patient Information 2015 [place], [place]. This information is not intended to replace advice given to you by your health care provider. Make sure you discuss any questions you have with your health care provider.

## 2020-12-07 NOTE — Progress Notes (Signed)
   LOW-RISK PREGNANCY VISIT Patient name: Tina Shepard MRN [id number]  Date of birth: [birth year] Chief Complaint:   Routine Prenatal Visit  History of Present Illness:   Tina Shepard is a 40 y.o. G20P3003 female at [redacted]w[redacted]d with an Estimated Date of Delivery: 02/08/21 being seen today for ongoing management of a low-risk pregnancy.  Today she reports no complaints. Contractions: Not present. Vag. Bleeding: None.  Movement: Present. denies leaking of fluid. Review of Systems:   Pertinent items are noted in HPI Denies abnormal vaginal discharge w/ itching/odor/irritation, headaches, visual changes, shortness of breath, chest pain, abdominal pain, severe nausea/vomiting, or problems with urination or bowel movements unless otherwise stated above. Pertinent History Reviewed:  Reviewed past medical,surgical, social, obstetrical and family history.  Reviewed problem list, medications and allergies. Physical Assessment:   Vitals:   12/07/20 0949  BP: 117/74  Pulse: 90  Weight: 194 lb (88 kg)  Body mass index is 37.89 kg/m.        Physical Examination:   General appearance: Well appearing, and in no distress  Mental status: Alert, oriented to person, place, and time  Skin: Warm & dry  Cardiovascular: Normal heart rate noted  Respiratory: Normal respiratory effort, no distress  Abdomen: Soft, gravid, nontender  Pelvic: Cervical exam deferred         Extremities: Edema: None  Fetal Status: Fetal Heart Rate (bpm): 157 Fundal Height: 31 cm Movement: Present    No results found for this or any previous visit (from the past 24 hour(s)).  Assessment & Plan:  1) Low-risk pregnancy G4P3003 at [redacted]w[redacted]d with an Estimated Date of Delivery: 02/08/21     Meds: No orders of the defined types were placed in this encounter.  Labs/procedures today: none  Plan:  Continue routine obstetrical care   Reviewed: Preterm labor symptoms and general obstetric precautions including but not limited to  vaginal bleeding, contractions, leaking of fluid and fetal movement were reviewed in detail with the patient.  All questions were answered. Has home bp cuff.  Check bp weekly, let us know if >140/90.   Follow-up: Return in about 2 weeks (around 12/21/2020) for LROB, in person.  No orders of the defined types were placed in this encounter.  Arabella Merles CNM 12/07/2020 10:01 AM

## 2020-12-21 ENCOUNTER — Other Ambulatory Visit: Payer: Self-pay

## 2020-12-21 ENCOUNTER — Ambulatory Visit (INDEPENDENT_AMBULATORY_CARE_PROVIDER_SITE_OTHER): Payer: Medicaid Other | Admitting: Obstetrics & Gynecology

## 2020-12-21 ENCOUNTER — Encounter: Payer: Self-pay | Admitting: Obstetrics & Gynecology

## 2020-12-21 VITALS — BP 123/73 | HR 109 | Wt 195.0 lb

## 2020-12-21 DIAGNOSIS — Z3A33 33 weeks gestation of pregnancy: Secondary | ICD-10-CM

## 2020-12-21 DIAGNOSIS — Z3480 Encounter for supervision of other normal pregnancy, unspecified trimester: Secondary | ICD-10-CM

## 2020-12-21 NOTE — Progress Notes (Signed)
   LOW-RISK PREGNANCY VISIT Patient name: Tina Shepard MRN [id number]  Date of birth: 04-13-1981 Chief Complaint:   Routine Prenatal Visit  History of Present Illness:   Tina Shepard is a 40 y.o. G38P3003 female at [redacted]w[redacted]d with an Estimated Date of Delivery: 02/08/21 being seen today for ongoing management of a low-risk pregnancy.   -?Chronic HTN- no meds, on ASA daily  Depression screen [facility] 2/9 11/02/2020 07/27/2020 06/20/2020  Decreased Interest 0 0 0  Down, Depressed, Hopeless 0 0 0  PHQ - 2 Score 0 0 0  Altered sleeping 1 0 0  Tired, decreased energy 1 0 0  Change in appetite 1 0 0  Feeling bad or failure about yourself  0 0 0  Trouble concentrating 0 0 0  Moving slowly or fidgety/restless 0 0 0  Suicidal thoughts 0 0 0  PHQ-9 Score 3 0 0    Today she reports no complaints. Contractions: Not present. Vag. Bleeding: None.  Movement: Present. denies leaking of fluid. Review of Systems:   Pertinent items are noted in HPI Denies abnormal vaginal discharge w/ itching/odor/irritation, headaches, visual changes, shortness of breath, chest pain, abdominal pain, severe nausea/vomiting, or problems with urination or bowel movements unless otherwise stated above. Pertinent History Reviewed:  Reviewed past medical,surgical, social, obstetrical and family history.  Reviewed problem list, medications and allergies.  Physical Assessment:   Vitals:   12/21/20 1102  BP: 123/73  Pulse: (!) 109  Weight: 195 lb (88.5 kg)  Body mass index is 38.08 kg/m.        Physical Examination:   General appearance: Well appearing, and in no distress  Mental status: Alert, oriented to person, place, and time  Skin: Warm & dry  Respiratory: Normal respiratory effort, no distress  Abdomen: Soft, gravid, nontender  Pelvic: Cervical exam deferred         Extremities: Edema: None  Psych:  mood and affect appropriate  Fetal Status: Fetal Heart Rate (bpm): 135 Fundal Height: 33 cm Movement: Present  Presentation: Vertex  Chaperone: n/a    No results found for this or any previous visit (from the past 24 hour(s)).   Assessment & Plan:  1) Low-risk pregnancy G4P3003 at [redacted]w[redacted]d with an Estimated Date of Delivery: 02/08/21   2) ?Chronic HTN- no meds, BP within normal range -continue ASA daily   Meds: No orders of the defined types were placed in this encounter.  Labs/procedures today: none  Plan:  Continue routine obstetrical care Next visit: prefers in person    Reviewed: Preterm labor symptoms and general obstetric precautions including but not limited to vaginal bleeding, contractions, leaking of fluid and fetal movement were reviewed in detail with the patient.  All questions were answered. Pt has home bp cuff. Check bp weekly, let us know if >140/90.   Follow-up: Return in about 2 weeks (around 01/04/2021) for LROB visit.  No orders of the defined types were placed in this encounter.   Myna Hidalgo, DO Attending Obstetrician & Gynecologist, [facility] for [employer], [facility]

## 2020-12-26 DIAGNOSIS — Z419 Encounter for procedure for purposes other than remedying health state, unspecified: Secondary | ICD-10-CM | POA: Diagnosis not present

## 2021-01-05 ENCOUNTER — Ambulatory Visit (INDEPENDENT_AMBULATORY_CARE_PROVIDER_SITE_OTHER): Payer: Medicaid Other | Admitting: Obstetrics & Gynecology

## 2021-01-05 ENCOUNTER — Encounter: Payer: Self-pay | Admitting: Obstetrics & Gynecology

## 2021-01-05 ENCOUNTER — Other Ambulatory Visit: Payer: Self-pay

## 2021-01-05 VITALS — BP 123/70 | HR 88 | Wt 199.0 lb

## 2021-01-05 DIAGNOSIS — Z3A35 35 weeks gestation of pregnancy: Secondary | ICD-10-CM

## 2021-01-05 DIAGNOSIS — Z3480 Encounter for supervision of other normal pregnancy, unspecified trimester: Secondary | ICD-10-CM

## 2021-01-05 NOTE — Progress Notes (Signed)
   LOW-RISK PREGNANCY VISIT Patient name: Tina Shepard MRN [id number]  Date of birth: 09/19/1980 Chief Complaint:   Routine Prenatal Visit  History of Present Illness:   Tina Shepard is a 40 y.o. G61P3003 female at [redacted]w[redacted]d with an Estimated Date of Delivery: 02/08/21 being seen today for ongoing management of a low-risk pregnancy.  Depression screen [facility] 2/9 11/02/2020 07/27/2020 06/20/2020  Decreased Interest 0 0 0  Down, Depressed, Hopeless 0 0 0  PHQ - 2 Score 0 0 0  Altered sleeping 1 0 0  Tired, decreased energy 1 0 0  Change in appetite 1 0 0  Feeling bad or failure about yourself  0 0 0  Trouble concentrating 0 0 0  Moving slowly or fidgety/restless 0 0 0  Suicidal thoughts 0 0 0  PHQ-9 Score 3 0 0    Today she reports no complaints. Contractions: Not present. Vag. Bleeding: None.  Movement: Present. denies leaking of fluid. Review of Systems:   Pertinent items are noted in HPI Denies abnormal vaginal discharge w/ itching/odor/irritation, headaches, visual changes, shortness of breath, chest pain, abdominal pain, severe nausea/vomiting, or problems with urination or bowel movements unless otherwise stated above. Pertinent History Reviewed:  Reviewed past medical,surgical, social, obstetrical and family history.  Reviewed problem list, medications and allergies. Physical Assessment:   Vitals:   01/05/21 0934  BP: 123/70  Pulse: 88  Weight: 199 lb (90.3 kg)  Body mass index is 38.86 kg/m.        Physical Examination:   General appearance: Well appearing, and in no distress  Mental status: Alert, oriented to person, place, and time  Skin: Warm & dry  Cardiovascular: Normal heart rate noted  Respiratory: Normal respiratory effort, no distress  Abdomen: Soft, gravid, nontender  Pelvic: Cervical exam deferred         Extremities: Edema: None  Fetal Status: Fetal Heart Rate (bpm): 150 Fundal Height: 37 cm Movement: Present Presentation: Vertex  Chaperone: n/a     No results found for this or any previous visit (from the past 24 hour(s)).  Assessment & Plan:  1) Low-risk pregnancy G4P3003 at [redacted]w[redacted]d with an Estimated Date of Delivery: 02/08/21   2) AMA,   3) BP is well controlled   Meds: No orders of the defined types were placed in this encounter.  Labs/procedures today:   Plan:  Continue routine obstetrical care  Next visit: prefers in person    Reviewed: Preterm labor symptoms and general obstetric precautions including but not limited to vaginal bleeding, contractions, leaking of fluid and fetal movement were reviewed in detail with the patient.  All questions were answered. Has home bp cuff. Rx faxed to . Check bp weekly, let us know if >140/90.   Follow-up: Return in about 2 weeks (around 01/19/2021) for LROB.  No orders of the defined types were placed in this encounter.   Lazaro Arms, MD 01/05/2021 9:59 AM

## 2021-01-19 ENCOUNTER — Other Ambulatory Visit (HOSPITAL_COMMUNITY)
Admission: RE | Admit: 2021-01-19 | Discharge: 2021-01-19 | Disposition: A | Discharge status: Home / Self Care | Payer: Medicaid Other | Source: Ambulatory Visit | Attending: Advanced Practice Midwife | Admitting: Advanced Practice Midwife

## 2021-01-19 ENCOUNTER — Other Ambulatory Visit: Payer: Self-pay

## 2021-01-19 ENCOUNTER — Ambulatory Visit (INDEPENDENT_AMBULATORY_CARE_PROVIDER_SITE_OTHER): Payer: Medicaid Other | Admitting: Advanced Practice Midwife

## 2021-01-19 VITALS — BP 135/84 | HR 100 | Wt 200.0 lb

## 2021-01-19 DIAGNOSIS — Z3483 Encounter for supervision of other normal pregnancy, third trimester: Secondary | ICD-10-CM | POA: Diagnosis not present

## 2021-01-19 DIAGNOSIS — Z3A37 37 weeks gestation of pregnancy: Secondary | ICD-10-CM

## 2021-01-19 NOTE — Patient Instructions (Signed)

## 2021-01-19 NOTE — Progress Notes (Signed)
   LOW-RISK PREGNANCY VISIT Patient name: Tina Shepard MRN [id number]  Date of birth: April 17, 1981 Chief Complaint:   Routine Prenatal Visit  History of Present Illness:   Tina Shepard is a 40 y.o. G93P3003 female at [redacted]w[redacted]d with an Estimated Date of Delivery: 02/08/21 being seen today for ongoing management of a low-risk pregnancy.  Today she reports pressure, some sharp pains. Contractions: Irritability. Vag. Bleeding: None.  Movement: Present. denies leaking of fluid. Review of Systems:   Pertinent items are noted in HPI Denies abnormal vaginal discharge w/ itching/odor/irritation, headaches, visual changes, shortness of breath, chest pain, abdominal pain, severe nausea/vomiting, or problems with urination or bowel movements unless otherwise stated above. Pertinent History Reviewed:  Reviewed past medical,surgical, social, obstetrical and family history.  Reviewed problem list, medications and allergies. Physical Assessment:   Vitals:   01/19/21 1117  BP: 135/84  Pulse: 100  Weight: 200 lb (90.7 kg)  Body mass index is 39.06 kg/m.        Physical Examination:   General appearance: Well appearing, and in no distress  Mental status: Alert, oriented to person, place, and time  Skin: Warm & dry  Cardiovascular: Normal heart rate noted  Respiratory: Normal respiratory effort, no distress  Abdomen: Soft, gravid, nontender  Pelvic: Cervical exam performed  Dilation: 2 Effacement (%): 80 Station: -2  Extremities: Edema: None  Fetal Status: Fetal Heart Rate (bpm): 140 Fundal Height: 38 cm Movement: Present Presentation: Vertex  Chaperone: Amanda Rash    No results found for this or any previous visit (from the past 24 hour(s)).  Assessment & Plan:  1) Low-risk pregnancy G4P3003 at [redacted]w[redacted]d with an Estimated Date of Delivery: 02/08/21   ,    Meds: No orders of the defined types were placed in this encounter.  Labs/procedures today: GBS, GC, CHL  Plan:  Continue routine  obstetrical care  Next visit: prefers in person    Reviewed: Term labor symptoms and general obstetric precautions including but not limited to vaginal bleeding, contractions, leaking of fluid and fetal movement were reviewed in detail with the patient.  All questions were answered. Has home bp cuff. Rx faxed. Check bp twice a week, let us know if >140/90.   Follow-up: Return in about 1 week (around 01/26/2021) for LROB.  Orders Placed This Encounter  Procedures   Culture, beta strep (group b only)   Jacklyn Shell DNP, CNM 01/19/2021 11:50 AM

## 2021-01-20 LAB — CERVICOVAGINAL ANCILLARY ONLY
Chlamydia: NEGATIVE
Comment: NEGATIVE
Comment: NORMAL
Neisseria Gonorrhea: NEGATIVE

## 2021-01-23 LAB — CULTURE, BETA STREP (GROUP B ONLY): Strep Gp B Culture: NEGATIVE

## 2021-01-25 ENCOUNTER — Other Ambulatory Visit: Payer: Self-pay

## 2021-01-25 ENCOUNTER — Ambulatory Visit (INDEPENDENT_AMBULATORY_CARE_PROVIDER_SITE_OTHER): Payer: Medicaid Other | Admitting: Women's Health

## 2021-01-25 ENCOUNTER — Encounter: Payer: Self-pay | Admitting: Women's Health

## 2021-01-25 VITALS — BP 136/80 | HR 92 | Wt 201.0 lb

## 2021-01-25 DIAGNOSIS — Z3483 Encounter for supervision of other normal pregnancy, third trimester: Secondary | ICD-10-CM

## 2021-01-25 NOTE — Progress Notes (Signed)
    LOW-RISK PREGNANCY VISIT Patient name: Tina Shepard MRN [id number]  Date of birth: 1981/04/20 Chief Complaint:   Routine Prenatal Visit  History of Present Illness:   Tina Shepard is a 40 y.o. G75P3003 female at [redacted]w[redacted]d with an Estimated Date of Delivery: 02/08/21 being seen today for ongoing management of a low-risk pregnancy.   Today she reports  low back contractions . Contractions: Irregular. Vag. Bleeding: None.  Movement: Present. denies leaking of fluid.  Depression screen [facility] 2/9 11/02/2020 07/27/2020 06/20/2020  Decreased Interest 0 0 0  Down, Depressed, Hopeless 0 0 0  PHQ - 2 Score 0 0 0  Altered sleeping 1 0 0  Tired, decreased energy 1 0 0  Change in appetite 1 0 0  Feeling bad or failure about yourself  0 0 0  Trouble concentrating 0 0 0  Moving slowly or fidgety/restless 0 0 0  Suicidal thoughts 0 0 0  PHQ-9 Score 3 0 0     GAD 7 : Generalized Anxiety Score 11/02/2020 07/27/2020 06/20/2020  Nervous, Anxious, on Edge 0 0 0  Control/stop worrying 0 0 0  Worry too much - different things 0 0 0  Trouble relaxing 0 0 0  Restless 1 0 0  Easily annoyed or irritable 0 0 0  Afraid - awful might happen 0 0 0  Total GAD 7 Score 1 0 0      Review of Systems:   Pertinent items are noted in HPI Denies abnormal vaginal discharge w/ itching/odor/irritation, headaches, visual changes, shortness of breath, chest pain, abdominal pain, severe nausea/vomiting, or problems with urination or bowel movements unless otherwise stated above. Pertinent History Reviewed:  Reviewed past medical,surgical, social, obstetrical and family history.  Reviewed problem list, medications and allergies. Physical Assessment:   Vitals:   01/25/21 1052  BP: 136/80  Pulse: 92  Weight: 201 lb (91.2 kg)  Body mass index is 39.26 kg/m.        Physical Examination:   General appearance: Well appearing, and in no distress  Mental status: Alert, oriented to person, place, and time  Skin: Warm  & dry  Cardiovascular: Normal heart rate noted  Respiratory: Normal respiratory effort, no distress  Abdomen: Soft, gravid, nontender  Pelvic: Cervical exam performed  Dilation: 2 Effacement (%): 80 Station: Ballotable  Extremities: Edema: None  Fetal Status: Fetal Heart Rate (bpm): 160 Fundal Height: 38 cm Movement: Present Presentation: Vertex  Chaperone: Faith Rogue   No results found for this or any previous visit (from the past 24 hour(s)).  Assessment & Plan:  1) Low-risk pregnancy G4P3003 at [redacted]w[redacted]d with an Estimated Date of Delivery: 02/08/21    Meds: No orders of the defined types were placed in this encounter.  Labs/procedures today: SVE  Plan:  Continue routine obstetrical care  Next visit: prefers in person    Reviewed: Term labor symptoms and general obstetric precautions including but not limited to vaginal bleeding, contractions, leaking of fluid and fetal movement were reviewed in detail with the patient.  All questions were answered.   Follow-up: Return in about 1 week (around 02/01/2021) for LROB, CNM, in person.  No future appointments.  No orders of the defined types were placed in this encounter.  Cheral Marker CNM, [facility] 01/25/2021 11:08 AM

## 2021-01-25 NOTE — Patient Instructions (Signed)
Tina Shepard, thank you for choosing our office today! We appreciate the opportunity to meet your healthcare needs. You may receive a short survey by mail, e-mail, or through [employer]. If you are happy with your care we would appreciate if you could take just a few minutes to complete the survey questions. We read all of your comments and take your feedback very seriously. Thank you again for choosing our office.  Center for [employer] Team at [facility]  [facility] at [facility] ([address] [place], [place] [address]) Entrance C, located off of [address] Free 24/7 valet parking   CLASSES: Go to [email or web address] to register for classes (childbirth, breastfeeding, waterbirth, infant CPR, daddy bootcamp, etc.)  Call the office [phone]) or go to [facility] if: You begin to have strong, frequent contractions Your water breaks.  Sometimes it is a big gush of fluid, sometimes it is just a trickle that keeps getting your panties wet or running down your legs You have vaginal bleeding.  It is normal to have a small amount of spotting if your cervix was checked.  You don't feel your baby moving like normal.  If you don't, get you something to eat and drink and lay down and focus on feeling your baby move.   If your baby is still not moving like normal, you should call the office or go to [facility].  Call the office [phone]) or go to [facility] hospital for these signs of pre-eclampsia: Severe headache that does not go away with Tylenol Visual changes- seeing spots, double, blurred vision Pain under your right breast or upper abdomen that does not go away with Tums or heartburn medicine Nausea and/or vomiting Severe swelling in your hands, feet, and face   [facility] Pediatricians/Family Doctors [facility]): [address] Colette Ribas, [phone]           [facility]: [address], [phone]                 [facility]): [address], [phone] (call to ask if accepting patients) [facility] Department: [address] [place], [phone]    [facility] Pediatricians/Family Doctors [facility]): [address], [phone] [facility]: [address] [place], [phone] [facility]: [address], [phone]  [facility] Doctors  [facility]): [phone] [facility]: [address] [phone]   [facility] Doctors [facility]: [address] [phone]  [facility] Doctors  [facility]: [phone], [phone]  Home Blood Pressure Monitoring for Patients   Your provider has recommended that you check your blood pressure (BP) at least once a week at home. If you do not have a blood pressure cuff at home, one will be provided for you. Contact your provider if you have not received your monitor within 1 week.   Helpful Tips for Accurate Home Blood Pressure Checks  Don't smoke, exercise, or drink caffeine 30 minutes before checking your BP Use the restroom before checking your BP (a full bladder can raise your pressure) Relax in a comfortable upright chair Feet on the ground Left arm resting comfortably on a flat surface at the level of your heart Legs uncrossed Back supported Sit quietly and don't talk Place the cuff on your bare arm Adjust snuggly, so that only two fingertips  can fit between your skin and the top of the cuff Check 2 readings separated by at least one minute Keep a log of your BP readings For a visual, please reference this diagram: http://ccnc.care/bpdiagram  Provider Name: [facility]     Phone: [phone]  Zone 1: ALL CLEAR  Continue to monitor your symptoms:  BP reading is less than 140 (top number) or less than 90 (bottom number)  No right  upper stomach pain No headaches or seeing spots No feeling nauseated or throwing up No swelling in face and hands  Zone 2: CAUTION Call your doctor's office for any of the following:  BP reading is greater than 140 (top number) or greater than 90 (bottom number)  Stomach pain under your ribs in the middle or right side Headaches or seeing spots Feeling nauseated or throwing up Swelling in face and hands  Zone 3: EMERGENCY  Seek immediate medical care if you have any of the following:  BP reading is greater than160 (top number) or greater than 110 (bottom number) Severe headaches not improving with Tylenol Serious difficulty catching your breath Any worsening symptoms from Zone 2   Braxton Hicks Contractions Contractions of the uterus can occur throughout pregnancy, but they are not always a sign that you are in labor. You may have practice contractions called Braxton Hicks contractions. These false labor contractions are sometimes confused with true labor. What are [place] contractions? Braxton Hicks contractions are tightening movements that occur in the muscles of the uterus before labor. Unlike true labor contractions, these contractions do not result in opening (dilation) and thinning of the cervix. Toward the end of pregnancy (32-34 weeks), Braxton Hicks contractions can happen more often and may become stronger. These contractions are sometimes difficult to tell apart from true labor because they can be very uncomfortable. You should not feel embarrassed if you go to the hospital with false labor. Sometimes, the only way to tell if you are in true labor is for your health care provider to look for changes in the cervix. The health care provider will do a physical exam and may monitor your contractions. If you are not in true labor, the exam should show that your cervix is not dilating and your water has not broken. If there are no other health problems associated with your  pregnancy, it is completely safe for you to be sent home with false labor. You may continue to have Braxton Hicks contractions until you go into true labor. How to tell the difference between true labor and false labor True labor Contractions last 30-70 seconds. Contractions become very regular. Discomfort is usually felt in the top of the uterus, and it spreads to the lower abdomen and low back. Contractions do not go away with walking. Contractions usually become more intense and increase in frequency. The cervix dilates and gets thinner. False labor Contractions are usually shorter and not as strong as true labor contractions. Contractions are usually irregular. Contractions are often felt in the front of the lower abdomen and in the groin. Contractions may go away when you walk around or change positions while lying down. Contractions get weaker and are shorter-lasting as time goes on. The cervix usually does not dilate or become thin. Follow these instructions at home:  Take over-the-counter and prescription medicines only as told by your health care provider. Keep up with your usual exercises and follow other instructions from your health care provider. Eat and drink lightly if you think  you are going into labor. If Braxton Hicks contractions are making you uncomfortable: Change your position from lying down or resting to walking, or change from walking to resting. Sit and rest in a tub of warm water. Drink enough fluid to keep your urine pale yellow. Dehydration may cause these contractions. Do slow and deep breathing several times an hour. Keep all follow-up prenatal visits as told by your health care provider. This is important. Contact a health care provider if: You have a fever. You have continuous pain in your abdomen. Get help right away if: Your contractions become stronger, more regular, and closer together. You have fluid leaking or gushing from your vagina. You pass  blood-tinged mucus (bloody show). You have bleeding from your vagina. You have low back pain that you never had before. You feel your baby's head pushing down and causing pelvic pressure. Your baby is not moving inside you as much as it used to. Summary Contractions that occur before labor are called Braxton Hicks contractions, false labor, or practice contractions. Braxton Hicks contractions are usually shorter, weaker, farther apart, and less regular than true labor contractions. True labor contractions usually become progressively stronger and regular, and they become more frequent. Manage discomfort from Braxton Hicks contractions by changing position, resting in a warm bath, drinking plenty of water, or practicing deep breathing. This information is not intended to replace advice given to you by your health care provider. Make sure you discuss any questions you have with your health care provider. Document Revised: 04/26/2017 Document Reviewed: 09/27/2016 Elsevier Patient Education  2020 Elsevier Inc.   

## 2021-01-26 ENCOUNTER — Encounter: Payer: Medicaid Other | Admitting: Women's Health

## 2021-01-26 DIAGNOSIS — Z419 Encounter for procedure for purposes other than remedying health state, unspecified: Secondary | ICD-10-CM | POA: Diagnosis not present

## 2021-02-02 ENCOUNTER — Encounter (HOSPITAL_COMMUNITY): Payer: Self-pay | Admitting: Obstetrics & Gynecology

## 2021-02-02 ENCOUNTER — Ambulatory Visit (INDEPENDENT_AMBULATORY_CARE_PROVIDER_SITE_OTHER): Payer: Medicaid Other | Admitting: Obstetrics & Gynecology

## 2021-02-02 ENCOUNTER — Inpatient Hospital Stay (HOSPITAL_COMMUNITY): Payer: Medicaid Other | Admitting: Anesthesiology

## 2021-02-02 ENCOUNTER — Encounter: Payer: Self-pay | Admitting: Obstetrics & Gynecology

## 2021-02-02 ENCOUNTER — Inpatient Hospital Stay (HOSPITAL_COMMUNITY)
Admission: AD | Admit: 2021-02-02 | Discharge: 2021-02-05 | DRG: 807 | Disposition: A | Discharge status: Home / Self Care | Payer: Medicaid Other | Attending: Obstetrics & Gynecology | Admitting: Obstetrics & Gynecology

## 2021-02-02 ENCOUNTER — Other Ambulatory Visit: Payer: Self-pay

## 2021-02-02 VITALS — BP 142/81 | HR 73 | Wt 203.0 lb

## 2021-02-02 DIAGNOSIS — O134 Gestational [pregnancy-induced] hypertension without significant proteinuria, complicating childbirth: Secondary | ICD-10-CM | POA: Diagnosis not present

## 2021-02-02 DIAGNOSIS — O164 Unspecified maternal hypertension, complicating childbirth: Secondary | ICD-10-CM | POA: Diagnosis not present

## 2021-02-02 DIAGNOSIS — Z30430 Encounter for insertion of intrauterine contraceptive device: Secondary | ICD-10-CM

## 2021-02-02 DIAGNOSIS — Z3A39 39 weeks gestation of pregnancy: Secondary | ICD-10-CM | POA: Diagnosis not present

## 2021-02-02 DIAGNOSIS — O4440 Low lying placenta NOS or without hemorrhage, unspecified trimester: Secondary | ICD-10-CM | POA: Diagnosis present

## 2021-02-02 DIAGNOSIS — Z20822 Contact with and (suspected) exposure to covid-19: Secondary | ICD-10-CM | POA: Diagnosis present

## 2021-02-02 DIAGNOSIS — Z3483 Encounter for supervision of other normal pregnancy, third trimester: Secondary | ICD-10-CM

## 2021-02-02 DIAGNOSIS — O26893 Other specified pregnancy related conditions, third trimester: Secondary | ICD-10-CM | POA: Diagnosis not present

## 2021-02-02 DIAGNOSIS — O09529 Supervision of elderly multigravida, unspecified trimester: Secondary | ICD-10-CM

## 2021-02-02 DIAGNOSIS — O368130 Decreased fetal movements, third trimester, not applicable or unspecified: Secondary | ICD-10-CM

## 2021-02-02 DIAGNOSIS — O133 Gestational [pregnancy-induced] hypertension without significant proteinuria, third trimester: Secondary | ICD-10-CM

## 2021-02-02 DIAGNOSIS — O139 Gestational [pregnancy-induced] hypertension without significant proteinuria, unspecified trimester: Secondary | ICD-10-CM | POA: Diagnosis present

## 2021-02-02 LAB — RESP PANEL BY RT-PCR (FLU A&B, COVID) ARPGX2
Influenza A by PCR: NEGATIVE
Influenza B by PCR: NEGATIVE
SARS Coronavirus 2 by RT PCR: NEGATIVE

## 2021-02-02 LAB — COMPREHENSIVE METABOLIC PANEL
ALT: 11 U/L (ref 0–44)
AST: 22 U/L (ref 15–41)
Albumin: 3.1 g/dL — ABNORMAL LOW (ref 3.5–5.0)
Alkaline Phosphatase: 151 U/L — ABNORMAL HIGH (ref 38–126)
Anion gap: 11 (ref 5–15)
BUN: 9 mg/dL (ref 6–20)
CO2: 19 mmol/L — ABNORMAL LOW (ref 22–32)
Calcium: 9 mg/dL (ref 8.9–10.3)
Chloride: 104 mmol/L (ref 98–111)
Creatinine, Ser: 0.69 mg/dL (ref 0.44–1.00)
GFR, Estimated: >60 mL/min (ref >60)
Glucose, Bld: 94 mg/dL (ref 70–99)
Potassium: 3.8 mmol/L (ref 3.5–5.1)
Sodium: 134 mmol/L — ABNORMAL LOW (ref 135–145)
Total Bilirubin: 0.4 mg/dL (ref 0.3–1.2)
Total Protein: 7.1 g/dL (ref 6.5–8.1)

## 2021-02-02 LAB — TYPE AND SCREEN
ABO/RH(D): O POS
Antibody Screen: NEGATIVE

## 2021-02-02 LAB — CBC
HCT: 41.6 % (ref 36.0–46.0)
Hemoglobin: 13.6 g/dL (ref 12.0–15.0)
MCH: 28.6 pg (ref 26.0–34.0)
MCHC: 32.7 g/dL (ref 30.0–36.0)
MCV: 87.6 fL (ref 80.0–100.0)
Platelets: 229 10*3/uL (ref 150–400)
RBC: 4.75 MIL/uL (ref 3.87–5.11)
RDW: 13.7 % (ref 11.5–15.5)
WBC: 10.9 10*3/uL — ABNORMAL HIGH (ref 4.0–10.5)
nRBC: 0 % (ref 0.0–0.2)

## 2021-02-02 LAB — PROTEIN / CREATININE RATIO, URINE
Creatinine, Urine: 221.02 mg/dL
Protein Creatinine Ratio: 0.25 mg/mg{Cre} — ABNORMAL HIGH (ref 0.00–0.15)
Total Protein, Urine: 55 mg/dL

## 2021-02-02 MED ORDER — TERBUTALINE SULFATE 1 MG/ML IJ SOLN: 0.2500 mg | Freq: Once | INTRAMUSCULAR | Status: DC | PRN

## 2021-02-02 MED ORDER — EPHEDRINE 5 MG/ML INJ: 10.0000 mg | INTRAVENOUS | Status: DC | PRN

## 2021-02-02 MED ORDER — ACETAMINOPHEN 325 MG PO TABS: 650.0000 mg | ORAL_TABLET | ORAL | Status: DC | PRN

## 2021-02-02 MED ORDER — OXYCODONE-ACETAMINOPHEN 5-325 MG PO TABS: 2.0000 | ORAL_TABLET | ORAL | Status: DC | PRN

## 2021-02-02 MED ORDER — OXYTOCIN-SODIUM CHLORIDE 30-0.9 UT/500ML-% IV SOLN: 1.0000 m[IU]/min | INTRAVENOUS | Status: DC

## 2021-02-02 MED ORDER — LIDOCAINE HCL (PF) 1 % IJ SOLN: 30.0000 mL | INTRAMUSCULAR | Status: DC | PRN

## 2021-02-02 MED ORDER — LACTATED RINGERS IV SOLN
500.0000 mL | INTRAVENOUS | Status: DC | PRN
  Administered 2021-02-02: 500 mL via INTRAVENOUS

## 2021-02-02 MED ORDER — LEVONORGESTREL 20.1 MCG/DAY IU IUD
1.0000 | INTRAUTERINE_SYSTEM | Freq: Once | INTRAUTERINE | Status: AC
  Administered 2021-02-03: 1 via INTRAUTERINE
  Filled 2021-02-02: qty 1

## 2021-02-02 MED ORDER — OXYTOCIN-SODIUM CHLORIDE 30-0.9 UT/500ML-% IV SOLN
1.0000 m[IU]/min | INTRAVENOUS | Status: DC
  Administered 2021-02-02: 2 m[IU]/min via INTRAVENOUS

## 2021-02-02 MED ORDER — PHENYLEPHRINE 40 MCG/ML (10ML) SYRINGE FOR IV PUSH (FOR BLOOD PRESSURE SUPPORT): 80.0000 ug | PREFILLED_SYRINGE | INTRAVENOUS | Status: DC | PRN

## 2021-02-02 MED ORDER — OXYTOCIN-SODIUM CHLORIDE 30-0.9 UT/500ML-% IV SOLN
2.5000 [IU]/h | INTRAVENOUS | Status: DC
  Administered 2021-02-03: 2.5 [IU]/h via INTRAVENOUS
  Filled 2021-02-02: qty 500

## 2021-02-02 MED ORDER — FENTANYL-BUPIVACAINE-NACL 0.5-0.125-0.9 MG/250ML-% EP SOLN
12.0000 mL/h | EPIDURAL | Status: DC | PRN
  Administered 2021-02-02: 12 mL/h via EPIDURAL
  Filled 2021-02-02: qty 250

## 2021-02-02 MED ORDER — OXYCODONE-ACETAMINOPHEN 5-325 MG PO TABS: 1.0000 | ORAL_TABLET | ORAL | Status: DC | PRN

## 2021-02-02 MED ORDER — FENTANYL CITRATE (PF) 100 MCG/2ML IJ SOLN
100.0000 ug | INTRAMUSCULAR | Status: DC | PRN
Start: 2021-02-02 — End: 2021-02-03
  Administered 2021-02-02: 100 ug via INTRAVENOUS

## 2021-02-02 MED ORDER — LACTATED RINGERS IV SOLN: 500.0000 mL | Freq: Once | INTRAVENOUS | Status: AC

## 2021-02-02 MED ORDER — FENTANYL CITRATE (PF) 100 MCG/2ML IJ SOLN
INTRAMUSCULAR | Status: AC
  Filled 2021-02-02: qty 2

## 2021-02-02 MED ORDER — LACTATED RINGERS IV SOLN: INTRAVENOUS | Status: DC

## 2021-02-02 MED ORDER — OXYTOCIN BOLUS FROM INFUSION
333.0000 mL | Freq: Once | INTRAVENOUS | Status: AC
  Administered 2021-02-03: 333 mL via INTRAVENOUS

## 2021-02-02 MED ORDER — DIPHENHYDRAMINE HCL 50 MG/ML IJ SOLN: 12.5000 mg | INTRAMUSCULAR | Status: DC | PRN

## 2021-02-02 MED ORDER — SODIUM CHLORIDE 0.9% FLUSH: 10.0000 mL | Freq: Two times a day (BID) | INTRAVENOUS | Status: DC

## 2021-02-02 MED ORDER — SODIUM CHLORIDE 0.9% FLUSH: 10.0000 mL | INTRAVENOUS | Status: DC | PRN

## 2021-02-02 MED ORDER — ONDANSETRON HCL 4 MG/2ML IJ SOLN
4.0000 mg | Freq: Four times a day (QID) | INTRAMUSCULAR | Status: DC | PRN
  Administered 2021-02-02: 4 mg via INTRAVENOUS
  Filled 2021-02-02: qty 2

## 2021-02-02 MED ORDER — LIDOCAINE HCL (PF) 1 % IJ SOLN
INTRAMUSCULAR | Status: DC | PRN
  Administered 2021-02-02 (×2): 4 mL via EPIDURAL

## 2021-02-02 MED ORDER — FENTANYL-BUPIVACAINE-NACL 0.5-0.125-0.9 MG/250ML-% EP SOLN: 12.0000 mL/h | EPIDURAL | Status: DC | PRN

## 2021-02-02 MED ORDER — SOD CITRATE-CITRIC ACID 500-334 MG/5ML PO SOLN
30.0000 mL | ORAL | Status: DC | PRN
  Administered 2021-02-02: 30 mL via ORAL
  Filled 2021-02-02: qty 30

## 2021-02-02 NOTE — Anesthesia Procedure Notes (Signed)
Epidural Patient location during procedure: OB Start time: 02/02/2021 9:24 PM End time: 02/02/2021 9:27 PM  Staffing Anesthesiologist: Kaylyn Layer, MD Performed: anesthesiologist   Preanesthetic Checklist Completed: patient identified, IV checked, risks and benefits discussed, monitors and equipment checked, pre-op evaluation and timeout performed  Epidural Patient position: sitting Prep: DuraPrep and site prepped and draped Patient monitoring: continuous pulse ox, blood pressure and heart rate Approach: midline Location: L3-L4 Injection technique: LOR air  Needle:  Needle type: Tuohy  Needle gauge: 17 G Needle length: 9 cm Needle insertion depth: 7 cm Catheter type: closed end flexible Catheter size: 19 Gauge Catheter at skin depth: 12 cm Test dose: negative and Other (1% lidocaine)  Assessment Events: blood not aspirated, injection not painful, no injection resistance, no paresthesia and negative IV test  Additional Notes Patient identified. Risks, benefits, and alternatives discussed with patient including but not limited to bleeding, infection, nerve damage, paralysis, failed block, incomplete pain control, headache, blood pressure changes, nausea, vomiting, reactions to medication, itching, and postpartum back pain. Confirmed with bedside nurse the patient's most recent platelet count. Confirmed with patient that they are not currently taking any anticoagulation, have any bleeding history, or any family history of bleeding disorders. Patient expressed understanding and wished to proceed. All questions were answered. Sterile technique was used throughout the entire procedure. Please see nursing notes for vital signs.   Crisp LOR on first pass. Test dose was given through epidural catheter and negative prior to continuing to dose epidural or start infusion. Warning signs of high block given to the patient including shortness of breath, tingling/numbness in hands, complete  motor block, or any concerning symptoms with instructions to call for help. Patient was given instructions on fall risk and not to get out of bed. All questions and concerns addressed with instructions to call with any issues or inadequate analgesia.  Reason for block:procedure for pain

## 2021-02-02 NOTE — Progress Notes (Signed)
Patient Vitals for the past 4 hrs:  BP Pulse Resp  02/02/21 2019 127/82 (!) 104 18  02/02/21 1934 (!) 132/58 86 16   Ctx are getting a little stronger, q 2-4 minutes.  Pitocin at 4 mu/min.  FHR Cat 1.  Preeclampsia labs neg.cx 4-5/80/-2.  AROM w/clear fluid. May go ahead and get epidural.

## 2021-02-02 NOTE — H&P (Addendum)
OBSTETRIC ADMISSION HISTORY AND PHYSICAL  Tina Shepard is a 40 y.o. female [phone] with IUP at [redacted]w[redacted]d by 8 wk [place] presenting for IOL d/t nonreactive NST. She reports +FMs, No LOF, no VB, no blurry vision, headaches or peripheral edema, and RUQ pain.  She plans on breast feeding. She request post placental IUD for birth control. She received her prenatal [facility] at [facility]   Dating: By 8 wk [place]  --->  Estimated Date of Delivery: 02/08/21  Sono:    @[redacted]w[redacted]d , CWD, normal anatomy, breech presentation, 220g, 46% EFW  Prenatal History/Complications:  - low lying placenta- resolved at 26 wks - AMA  Past Medical History: Past Medical History:  Diagnosis Date   Hypertension     Past Surgical History: Past Surgical History:  Procedure Laterality Date   panniculectomy      Obstetrical History: OB History     Gravida  4   Para  3   Term  3   Preterm      AB      Living  3      SAB      IAB      Ectopic      Multiple      Live Births  3           Social History Social History   Socioeconomic History   Marital status: Single    Spouse name: Not on file   Number of children: Not on file   Years of education: Not on file   Highest education level: Not on file  Occupational History   Not on file  Tobacco Use   Smoking status: Never   Smokeless tobacco: Never  Vaping Use   Vaping Use: Never used  Substance and Sexual Activity   Alcohol use: Not Currently   Drug use: No   Sexual activity: Yes    Birth control/protection: None  Other Topics Concern   Not on file  Social History Narrative   Not on file   Social Determinants of Health   Financial Resource Strain: Low Risk    Difficulty of Paying Living Expenses: Not hard at all  Food Insecurity: No Food Insecurity   Worried About in the Last Year: Never true   Ran Out of Food in the Last Year: Never true  Transportation Needs: No Transportation Needs   Lack of Transportation  (Medical): No   Lack of Transportation (Non-Medical): No  Physical Activity: Insufficiently Active   Days of Exercise per Week: 3 days   Minutes of Exercise per Session: 20 min  Stress: No Stress Concern Present   Feeling of Stress : Not at all  Social Connections: Moderately Isolated   Frequency of Communication with Friends and Family: More than three times a week   Frequency of Social Gatherings with Friends and Family: Twice a week   Attends Religious Services: 1 to 4 times per year   Active Member of Programme researcher, broadcasting/film/video or Organizations: No   Attends [employer]: Never   Marital Status: Never married    Family History: Family History  Problem Relation Age of Onset   Diabetes Maternal Grandmother     Allergies: No Known Allergies  Medications Prior to Admission  Medication Sig Dispense Refill Last Dose   aspirin 81 MG chewable tablet Chew 2 tablets (162 mg total) by mouth daily. 60 tablet 7    Blood Pressure Monitor MISC For regular home bp monitoring during  pregnancy 1 each 0    prenatal vitamin w/FE, FA (PRENATAL 1 + 1) 27-1 MG TABS tablet Take 1 tablet by mouth daily at 12 noon. 30 tablet 11      Review of Systems   All systems reviewed and negative except as stated in HPI  Last menstrual period 04/10/2020. General appearance: alert, cooperative, and no distress Abdomen: soft, non-tender Pelvic: soft fundus  Extremities: no sign of DVT Presentation: per office exam vertex. Will reassess once we have IV access  Fetal monitoring: Baseline: 140 bpm, Variability: Good {> 6 bpm), Accelerations: present, and Decelerations: Absent Uterine activity: Frequency: Every 2-3 minutes, Intensity: moderate   Prenatal labs: ABO, Rh: O/Positive/-- (03/02 1018) Antibody: Negative (06/08 0852) Rubella: 1.00 (03/02 1018) RPR: Non Reactive (06/08 0852)  HBsAg: Negative (03/02 1018)  HIV: Non Reactive (06/08 0852)  GBS: Negative/-- (08/25 1405)  1 hr Glucola normal Genetic  screening  negative  Anatomy US normal  Prenatal Transfer Tool  Maternal Diabetes: No Genetic Screening: Normal Maternal Ultrasounds/Referrals: Normal Fetal Ultrasounds or other Referrals:  None Maternal Substance Abuse:  No Significant Maternal Medications:  None Significant Maternal Lab Results: Group B Strep negative  No results found for this or any previous visit (from the past 24 hour(s)).  Patient Active Problem List   Diagnosis Date Noted   Low-lying placenta 09/07/2020   Encounter for supervision of normal pregnancy, antepartum 07/27/2020   AMA (advanced maternal age) multigravida 35+ 07/27/2020    Assessment/Plan:  Tina Shepard is a 40 y.o. G4P3003 at [redacted]w[redacted]d here for IOL d/t nonreactive NST.  #Labor: Need to assess cervical exam prior to initiating induction.  Waiting for IV access.  Considering AROM versus Pitocin pending cervical exam.  #Pain: As needed epidural #FWB: Cat 1  #ID:  GBS negative #MOF: Breastfeeding #MOC: IUD - postplacental    Lona Millard, Medical Student  02/02/2021, 2:52 PM  Midwife attestation: I have seen and examined this patient; I agree with above documentation in the student's note.   PE: Gen: calm comfortable, NAD Resp: normal effort and rate Abd: gravid  ROS, labs, PMH reviewed  Assessment/Plan: [redacted] weeks gestation Labor: latent FWB: Cat I GBS: neg Admit to LD Pitocin IOL Anticipate SVD  Donette Larry, CNM  02/02/2021, 6:28 PM

## 2021-02-02 NOTE — Progress Notes (Signed)
LOW-RISK PREGNANCY VISIT Patient name: Tina Shepard MRN [id number]  Date of birth: 07/17/1980 Chief Complaint:   Routine Prenatal Visit  History of Present Illness:   Tina Shepard is a 40 y.o. G23P3003 female at [redacted]w[redacted]d with an Estimated Date of Delivery: 02/08/21 being seen today for ongoing management of a low-risk pregnancy.  Depression screen [facility] 2/9 11/02/2020 07/27/2020 06/20/2020  Decreased Interest 0 0 0  Down, Depressed, Hopeless 0 0 0  PHQ - 2 Score 0 0 0  Altered sleeping 1 0 0  Tired, decreased energy 1 0 0  Change in appetite 1 0 0  Feeling bad or failure about yourself  0 0 0  Trouble concentrating 0 0 0  Moving slowly or fidgety/restless 0 0 0  Suicidal thoughts 0 0 0  PHQ-9 Score 3 0 0    Today she reports  decreased fetal movement . Contractions: Irregular. Vag. Bleeding: None.  Movement: (!) Decreased. denies leaking of fluid. Review of Systems:   Pertinent items are noted in HPI Denies abnormal vaginal discharge w/ itching/odor/irritation, headaches, visual changes, shortness of breath, chest pain, abdominal pain, severe nausea/vomiting, or problems with urination or bowel movements unless otherwise stated above. Pertinent History Reviewed:  Reviewed past medical,surgical, social, obstetrical and family history.  Reviewed problem list, medications and allergies. Physical Assessment:   Vitals:   02/02/21 1049  BP: (!) 142/81  Pulse: 73  Weight: 203 lb (92.1 kg)  Body mass index is 39.65 kg/m.        Physical Examination:   General appearance: Well appearing, and in no distress  Mental status: Alert, oriented to person, place, and time  Skin: Warm & dry  Cardiovascular: Normal heart rate noted  Respiratory: Normal respiratory effort, no distress  Abdomen: Soft, gravid, nontender  Pelvic: Cervical exam performed  Dilation: 4 Effacement (%): 50 Station: -3  Extremities: Edema: None  Fetal Status: Fetal Heart Rate (bpm): 140 Fundal Height: 38 cm  Movement: (!) Decreased Presentation: Vertex  Chaperone: Latisha Cresenzo    No results found for this or any previous visit (from the past 24 hour(s)).  Assessment & Plan:  1) Low-risk pregnancy G4P3003 at [redacted]w[redacted]d with an Estimated Date of Delivery: 02/08/21   2) Decreased movement, NR NST + scalp stim, no decels, discussed with Dr Macon Large who agrees with admission with IOL   Meds: No orders of the defined types were placed in this encounter.  Labs/procedures today: NST  Heidi Dach is at [redacted]w[redacted]d Estimated Date of Delivery: 02/08/21  NST being performed due to decreased fetal movement for 2 days  Today the NST is Non Reactive  Fetal Monitoring:  Baseline: 140 bpm, Variability: Fair (1-6 bpm), Accelerations: non reactive, and Decelerations: Absent   nonreactive  The accelerations are >15 bpm and more than 2 in 20 minutes  Final diagnosis:  Non Reactive NST, +scalp stimulation  Lazaro Arms, MD    Plan:  To [facility] for IOL Next visit: prefers in person    Reviewed: Term labor symptoms and general obstetric precautions including but not limited to vaginal bleeding, contractions, leaking of fluid and fetal movement were reviewed in detail with the patient.  All questions were answered.  home bp cuff. Rx faxed to . Check bp weekly, let us know if >140/90.   Follow-up: Return in about 6 weeks (around 03/16/2021) for post partum visit.  No orders of the defined types were placed in this encounter.   Lazaro Arms, MD  02/02/2021 11:42 AM

## 2021-02-02 NOTE — Progress Notes (Signed)
Called lab to come draw lab work.

## 2021-02-02 NOTE — Progress Notes (Signed)
LABOR PROGRESS NOTE  Tina Shepard is a 40 y.o. [phone] at [redacted]w[redacted]d  admitted for nonreassuring fetal heart tones  Subjective: Patient was extremely uncomfortable and given IV pain medicine.  After the IV pain medicine she was much more comfortable.  Objective: BP 131/86 (BP Location: Left Arm)   Pulse 86   Temp 98.3 F (36.8 C) (Oral)   Resp 16   Ht 5' (1.524 m)   Wt 92.1 kg   LMP 04/10/2020 (LMP Unknown)   BMI 39.65 kg/m  or  Vitals:   02/02/21 1605  BP: 131/86  Pulse: 86  Resp: 16  Temp: 98.3 F (36.8 C)  TempSrc: Oral  Weight: 92.1 kg  Height: 5' (1.524 m)    Dilation: 4 Effacement (%): 50 Station: Ballotable Presentation: Vertex Exam by:: Dr. Nobie Putnam FHT: baseline rate 140 bpm, moderate varibility, positive acel, no decel Toco: Every 2-3 minutes  Labs: Lab Results  Component Value Date   WBC 9.3 11/02/2020   HGB 13.0 11/02/2020   HCT 38.4 11/02/2020   MCV 92 11/02/2020   PLT 269 11/02/2020    Patient Active Problem List   Diagnosis Date Noted   Indication for care in labor or delivery 02/02/2021   Low-lying placenta 09/07/2020   Encounter for supervision of normal pregnancy, antepartum 07/27/2020   AMA (advanced maternal age) multigravida 35+ 07/27/2020    Assessment / Plan: 40 y.o. [id number] at [redacted]w[redacted]d here for IOL for nonreassuring NST  Labor: Induction planned with Pitocin.  Fetus is ballotable but consider AROM once well applied to the cervix. Fetal Wellbeing: Category 1, reassuring Pain Control: As needed IV pain meds as well as epidural as needed Anticipated MOD: Vaginal delivery  Derrel Nip, MD  PGY-3, [facility]  02/02/2021, 7:02 PM

## 2021-02-02 NOTE — Anesthesia Preprocedure Evaluation (Signed)
Anesthesia Evaluation  Patient identified by MRN, date of birth, ID band Patient awake    Reviewed: Allergy & Precautions, Patient's Chart, lab work & pertinent test results  History of Anesthesia Complications Negative for: history of anesthetic complications  Airway Mallampati: II  TM Distance: >3 FB Neck ROM: Full    Dental no notable dental hx.    Pulmonary neg pulmonary ROS,    Pulmonary exam normal        Cardiovascular hypertension, Normal cardiovascular exam     Neuro/Psych negative neurological ROS  negative psych ROS   GI/Hepatic negative GI ROS, Neg liver ROS,   Endo/Other  negative endocrine ROS  Renal/GU negative Renal ROS  negative genitourinary   Musculoskeletal negative musculoskeletal ROS (+)   Abdominal   Peds  Hematology negative hematology ROS (+)   Anesthesia Other Findings Day of surgery medications reviewed with patient.  Reproductive/Obstetrics (+) Pregnancy                             Anesthesia Physical Anesthesia Plan  ASA: 2  Anesthesia Plan: Epidural   Post-op Pain Management:    Induction:   PONV Risk Score and Plan: Treatment may vary due to age or medical condition  Airway Management Planned: Natural Airway  Additional Equipment:   Intra-op Plan:   Post-operative Plan:   Informed Consent: I have reviewed the patients History and Physical, chart, labs and discussed the procedure including the risks, benefits and alternatives for the proposed anesthesia with the patient or authorized representative who has indicated his/her understanding and acceptance.       Plan Discussed with:   Anesthesia Plan Comments:         Anesthesia Quick Evaluation

## 2021-02-03 ENCOUNTER — Encounter (HOSPITAL_COMMUNITY): Payer: Self-pay | Admitting: Obstetrics & Gynecology

## 2021-02-03 DIAGNOSIS — Z3A39 39 weeks gestation of pregnancy: Secondary | ICD-10-CM

## 2021-02-03 DIAGNOSIS — Z30430 Encounter for insertion of intrauterine contraceptive device: Secondary | ICD-10-CM

## 2021-02-03 DIAGNOSIS — O134 Gestational [pregnancy-induced] hypertension without significant proteinuria, complicating childbirth: Secondary | ICD-10-CM

## 2021-02-03 DIAGNOSIS — O139 Gestational [pregnancy-induced] hypertension without significant proteinuria, unspecified trimester: Secondary | ICD-10-CM | POA: Diagnosis present

## 2021-02-03 LAB — RPR: RPR Ser Ql: NONREACTIVE

## 2021-02-03 MED ORDER — PRENATAL MULTIVITAMIN CH
1.0000 | ORAL_TABLET | Freq: Every day | ORAL | Status: DC
Start: 2021-02-03 — End: 2021-02-05
  Administered 2021-02-04 – 2021-02-05 (×2): 1 via ORAL
  Filled 2021-02-03 (×2): qty 1

## 2021-02-03 MED ORDER — MISOPROSTOL 200 MCG PO TABS
ORAL_TABLET | ORAL | Status: AC
  Filled 2021-02-03: qty 5

## 2021-02-03 MED ORDER — DIPHENHYDRAMINE HCL 25 MG PO CAPS: 25.0000 mg | ORAL_CAPSULE | Freq: Four times a day (QID) | ORAL | Status: DC | PRN

## 2021-02-03 MED ORDER — BISACODYL 10 MG RE SUPP: 10.0000 mg | Freq: Every day | RECTAL | Status: DC | PRN

## 2021-02-03 MED ORDER — IBUPROFEN 600 MG PO TABS
600.0000 mg | ORAL_TABLET | Freq: Four times a day (QID) | ORAL | Status: DC
Start: 2021-02-03 — End: 2021-02-05
  Administered 2021-02-03 – 2021-02-05 (×9): 600 mg via ORAL
  Filled 2021-02-03 (×9): qty 1

## 2021-02-03 MED ORDER — DOCUSATE SODIUM 100 MG PO CAPS
100.0000 mg | ORAL_CAPSULE | Freq: Two times a day (BID) | ORAL | Status: DC
  Administered 2021-02-03 – 2021-02-05 (×4): 100 mg via ORAL
  Filled 2021-02-03 (×4): qty 1

## 2021-02-03 MED ORDER — FERROUS SULFATE 325 (65 FE) MG PO TABS
325.0000 mg | ORAL_TABLET | ORAL | Status: DC
Start: 2021-02-03 — End: 2021-02-05
  Administered 2021-02-05: 325 mg via ORAL
  Filled 2021-02-03: qty 1

## 2021-02-03 MED ORDER — BENZOCAINE-MENTHOL 20-0.5 % EX AERO
1.0000 "application " | INHALATION_SPRAY | CUTANEOUS | Status: DC | PRN
  Administered 2021-02-03: 1 via TOPICAL
  Filled 2021-02-03: qty 56

## 2021-02-03 MED ORDER — FLEET ENEMA 7-19 GM/118ML RE ENEM: 1.0000 | ENEMA | Freq: Every day | RECTAL | Status: DC | PRN

## 2021-02-03 MED ORDER — WITCH HAZEL-GLYCERIN EX PADS
1.0000 "application " | MEDICATED_PAD | CUTANEOUS | Status: DC | PRN
  Administered 2021-02-04: 1 via TOPICAL

## 2021-02-03 MED ORDER — ACETAMINOPHEN 325 MG PO TABS: 650.0000 mg | ORAL_TABLET | ORAL | Status: DC | PRN

## 2021-02-03 MED ORDER — ONDANSETRON HCL 4 MG/2ML IJ SOLN: 4.0000 mg | INTRAMUSCULAR | Status: DC | PRN

## 2021-02-03 MED ORDER — ONDANSETRON HCL 4 MG PO TABS: 4.0000 mg | ORAL_TABLET | ORAL | Status: DC | PRN

## 2021-02-03 MED ORDER — LACTATED RINGERS AMNIOINFUSION: INTRAVENOUS | Status: DC

## 2021-02-03 MED ORDER — METHYLERGONOVINE MALEATE 0.2 MG/ML IJ SOLN: 0.2000 mg | INTRAMUSCULAR | Status: DC | PRN

## 2021-02-03 MED ORDER — SIMETHICONE 80 MG PO CHEW: 80.0000 mg | CHEWABLE_TABLET | ORAL | Status: DC | PRN

## 2021-02-03 MED ORDER — DIBUCAINE (PERIANAL) 1 % EX OINT
1.0000 "application " | TOPICAL_OINTMENT | CUTANEOUS | Status: DC | PRN
  Administered 2021-02-04: 1 via RECTAL
  Filled 2021-02-03: qty 28

## 2021-02-03 MED ORDER — TETANUS-DIPHTH-ACELL PERTUSSIS 5-2.5-18.5 LF-MCG/0.5 IM SUSY: 0.5000 mL | PREFILLED_SYRINGE | Freq: Once | INTRAMUSCULAR | Status: DC

## 2021-02-03 MED ORDER — MISOPROSTOL 200 MCG PO TABS
1000.0000 ug | ORAL_TABLET | Freq: Once | ORAL | Status: AC
  Administered 2021-02-03: 1000 ug via RECTAL

## 2021-02-03 MED ORDER — COCONUT OIL OIL
1.0000 "application " | TOPICAL_OIL | Status: DC | PRN
  Administered 2021-02-04 – 2021-02-05 (×2): 1 via TOPICAL

## 2021-02-03 MED ORDER — METHYLERGONOVINE MALEATE 0.2 MG PO TABS: 0.2000 mg | ORAL_TABLET | ORAL | Status: DC | PRN

## 2021-02-03 MED ORDER — MEASLES, MUMPS & RUBELLA VAC IJ SOLR: 0.5000 mL | Freq: Once | INTRAMUSCULAR | Status: DC

## 2021-02-03 MED ORDER — MEDROXYPROGESTERONE ACETATE 150 MG/ML IM SUSP: 150.0000 mg | INTRAMUSCULAR | Status: DC | PRN

## 2021-02-03 NOTE — Lactation Note (Signed)
This note was copied from a baby's chart. Lactation Consultation Note  Patient Name: Tina Shepard SEGBT'D Date: 02/03/2021 Reason for consult: Initial assessment;Mother's request;Term;Other (Comment) (PIH) Age:38 hours  LC assisted with latching infant with audible swallows heard with breast compression.   Plan 1. To feed based on cues 8-12x 24hr period. Mom to offer breasts and listen for audible swallows.  2. If unable to latch, Mom to do breast massage and hand expression offering EBM on spoon. 3 I and O sheet reviewed.  4. LC brochure of inpatient and outpatient services reviewed.  All questions answered at the end of the visit.   Maternal Data Has patient been taught Hand Expression?: Yes Does the patient have breastfeeding experience prior to this delivery?: Yes How long did the patient breastfeed?: 1 month  Feeding Mother's Current Feeding Choice: Breast Milk  LATCH Score Latch: Repeated attempts needed to sustain latch, nipple held in mouth throughout feeding, stimulation needed to elicit sucking reflex.  Audible Swallowing: Spontaneous and intermittent  Type of Nipple: Everted at rest and after stimulation  Comfort (Breast/Nipple): Soft / non-tender  Hold (Positioning): Assistance needed to correctly position infant at breast and maintain latch.  LATCH Score: 8   Lactation Tools Discussed/Used    Interventions Interventions: Breast feeding basics reviewed;Breast compression;Assisted with latch;Adjust position;Skin to skin;Support pillows;Breast massage;Position options;Hand express;Expressed milk;Education  Discharge Pump: Manual WIC Program: Yes  Consult Status Consult Status: Follow-up Date: 02/04/21 Follow-up type: In-patient    Tina Shepard  Tina Shepard 02/03/2021, 6:04 PM

## 2021-02-03 NOTE — Discharge Summary (Signed)
Postpartum Discharge Summary    Patient Name: Tina Shepard DOB: 1981/03/04 MRN: [id number]  Date of admission: 02/02/2021 Delivery date:02/03/2021  Delivering provider: Christin Fudge  Date of discharge: 02/04/2021  Admitting diagnosis: Indication for care in labor or delivery [O75.9] Intrauterine pregnancy: [redacted]w[redacted]d     Secondary diagnosis:  Active Problems:   Indication for care in labor or delivery   Gestational hypertension  Additional problems: None    Discharge diagnosis: Term Pregnancy Delivered                                              Post partum procedures: PP IUD  Augmentation: AROM and Pitocin Complications: None  Hospital course: Induction of Labor With Vaginal Delivery   40 y.o. yo [phone] at [redacted]w[redacted]d was admitted to the hospital 02/02/2021 for induction of labor.  Indication for induction:  NRFHT .  Patient had an uncomplicated labor course as follows: Membrane Rupture Time/Date: 8:47 PM ,02/02/2021   Delivery Method:Vaginal, Spontaneous  Episiotomy: None  Lacerations:  None  Details of delivery can be found in separate delivery note.  Patient had a routine postpartum course. Patient is discharged home 02/04/21.  Newborn Data: Birth date:02/03/2021  Birth time:6:42 AM  Gender:Female  Living status:Living  Apgars:8 ,9  Weight:7 lb 12.9 oz (3.541 kg)   Magnesium Sulfate received: No BMZ received: No Rhophylac:N/A MMR:N/A T-DaP:Given prenatally Flu: N/A Transfusion:No  Physical exam  Vitals:   02/03/21 1415 02/03/21 1843 02/03/21 2150 02/04/21 0538  BP: (!) 106/57 (!) 112/57 (!) 120/59 (!) 113/58  Pulse: 79  76 69  Resp: $Remo'18 16 16 16  'kABXV$ Temp: 98.3 F (36.8 C) 98.1 F (36.7 C) 97.9 F (36.6 C) 98.2 F (36.8 C)  TempSrc: Oral Oral Oral Oral  SpO2: 97% 97%    Weight:      Height:       General: alert, cooperative, and no distress Lochia: appropriate Uterine Fundus: firm Incision: N/A DVT Evaluation: No evidence of DVT seen on physical  exam. Labs: Lab Results  Component Value Date   WBC 10.9 (H) 02/02/2021   HGB 13.6 02/02/2021   HCT 41.6 02/02/2021   MCV 87.6 02/02/2021   PLT 229 02/02/2021   CMP Latest Ref Rng & Units 02/02/2021  Glucose 70 - 99 mg/dL 94  BUN 6 - 20 mg/dL 9  Creatinine 0.44 - 1.00 mg/dL 0.69  Sodium 135 - 145 mmol/L 134(L)  Potassium 3.5 - 5.1 mmol/L 3.8  Chloride 98 - 111 mmol/L 104  CO2 22 - 32 mmol/L 19(L)  Calcium 8.9 - 10.3 mg/dL 9.0  Total Protein 6.5 - 8.1 g/dL 7.1  Total Bilirubin 0.3 - 1.2 mg/dL 0.4  Alkaline Phos 38 - 126 U/L 151(H)  AST 15 - 41 U/L 22  ALT 0 - 44 U/L 11   Edinburgh Score: No flowsheet data found.   After visit meds:  Allergies as of 02/04/2021   No Known Allergies      Medication List     STOP taking these medications    aspirin 81 MG chewable tablet       TAKE these medications    Blood Pressure Monitor Misc For regular home bp monitoring during pregnancy   docusate sodium 100 MG capsule Commonly known as: COLACE Take 1 capsule (100 mg total) by mouth 2 (two) times daily.   ibuprofen 600 MG tablet  Commonly known as: ADVIL Take 1 tablet (600 mg total) by mouth every 6 (six) hours.   prenatal vitamin w/FE, FA 27-1 MG Tabs tablet Take 1 tablet by mouth daily at 12 noon.         Discharge home in stable condition Infant Feeding: Breast Infant Disposition:home with mother Discharge instruction: per After Visit Summary and Postpartum booklet. Activity: Advance as tolerated. Pelvic rest for 6 weeks.  Diet: routine diet Future Appointments: Future Appointments  Date Time Provider [place]  02/10/2021 11:50 AM [facility] NURSE [facility]  03/16/2021 10:30 AM Cresenzo-Dishmon, Joaquim Lai, CNM [facility]   Follow up Visit: Please schedule this patient for a In person postpartum visit in 4 weeks with the following provider: Any provider. Additional Postpartum F/U:BP check 1 week  Low risk pregnancy complicated by:  HTN Delivery mode:  Vaginal, Spontaneous  Anticipated Birth Control:  PP IUD placed   02/04/2021 Gabriel Carina, CNM

## 2021-02-03 NOTE — Procedures (Signed)
  Post-Placental IUD Insertion Procedure Note  Patient identified, informed consent signed prior to delivery, signed copy in chart, time out was performed.    Vaginal, labial and perineal areas thoroughly inspected for lacerations. 0 degree laceration identified - Liletta/  - I- IUD inserted with inserter per manufacturer's instructions.    Strings trimmed to the level of the introitus. Patient tolerated procedure well.    Patient given post procedure instructions and IUD care card with expiration date.  Patient is asked to keep IUD strings tucked in her vagina until her postpartum follow up visit in 4-6 weeks. Patient advised to abstain from sexual intercourse and pulling on strings before her follow-up visit. Patient verbalized an understanding of the plan of care and agrees.

## 2021-02-03 NOTE — Lactation Note (Signed)
This note was copied from a baby's chart. Lactation Consultation Note  Patient Name: Tina Shepard RZNBV'A Date: 02/03/2021 Reason for consult: Term Age:38 hours   Initial L&D Consult:  Visited with family >1 hour after birth. RN and lab tech in room.  Offered to assist with latching and mother interested.  Mother stated, "She's hungry."  Latched easily and baby began eagerly sucking.  Mother excited and felt a good tug.  Denied pain with latching.  Reassured her that lactation services will be available on the M/B unit.  Allowed time for family bonding.     Maternal Data Has patient been taught Hand Expression?: No  Feeding Mother's Current Feeding Choice: Breast Milk and Formula  LATCH Score Latch: Grasps breast easily, tongue down, lips flanged, rhythmical sucking.  Audible Swallowing: None  Type of Nipple: Everted at rest and after stimulation  Comfort (Breast/Nipple): Soft / non-tender  Hold (Positioning): Assistance needed to correctly position infant at breast and maintain latch.  LATCH Score: 7   Lactation Tools Discussed/Used    Interventions    Discharge    Consult Status Consult Status: Follow-up from L&D    Kden Wagster R Raney Antwine 02/03/2021, 8:02 AM

## 2021-02-03 NOTE — Progress Notes (Signed)
Patient Vitals for the past 4 hrs:  BP Temp Temp src Pulse Resp  02/03/21 0100 (!) 141/90 -- -- 92 18  02/03/21 0031 (!) 142/72 -- -- 93 18  02/03/21 0005 124/61 98.6 F (37 C) Oral 78 18  02/02/21 2230 139/73 -- -- 90 --  02/02/21 2220 (!) 141/78 -- -- 86 16  02/02/21 2216 131/73 -- -- 88 16  02/02/21 2210 138/71 -- -- 84 16  02/02/21 2206 (!) 142/71 -- -- 90 16  02/02/21 2201 135/74 -- -- 85 16  02/02/21 2155 140/82 -- -- 97 16  02/02/21 2151 (!) 144/73 -- -- 74 16  02/02/21 2146 135/81 98.9 F (37.2 C) Oral 93 16  02/02/21 2140 125/66 -- -- (!) 108 18  02/02/21 2135 (!) 141/75 -- -- 99 18   Comfortable w/epidural. Meets criteria for GHTN.  Cx 9/90/-1.  Having some repetitive variable decels, back to baseline at end of ctx.  IUPC placed and amnioinfusion started.  Anticipat SVD soon

## 2021-02-03 NOTE — Anesthesia Postprocedure Evaluation (Signed)
Anesthesia Post Note  Patient: Tina Shepard  Procedure(s) Performed: AN AD HOC LABOR EPIDURAL     Patient location during evaluation: Mother Baby Anesthesia Type: Epidural Level of consciousness: awake and alert Pain management: pain level controlled Vital Signs Assessment: post-procedure vital signs reviewed and stable Respiratory status: spontaneous breathing, nonlabored ventilation and respiratory function stable Cardiovascular status: stable Postop Assessment: no headache, no backache, epidural receding, no apparent nausea or vomiting, able to ambulate, adequate PO intake and patient able to bend at knees Anesthetic complications: no   No notable events documented.  Last Vitals:  Vitals:   02/03/21 0902 02/03/21 1415  BP: (!) 116/55 (!) 106/57  Pulse: 86 79  Resp: 16 18  Temp: 37.1 C 36.8 C  SpO2: 100% 97%    Last Pain:  Vitals:   02/03/21 1415  TempSrc: Oral  PainSc: 0-No pain   Pain Goal:                Epidural/Spinal Function Cutaneous sensation: Normal sensation (02/03/21 1415)  Brydon Spahr Hristova

## 2021-02-04 MED ORDER — IBUPROFEN 600 MG PO TABS: 600.0000 mg | ORAL_TABLET | Freq: Four times a day (QID) | ORAL | 0 refills | Status: AC

## 2021-02-04 MED ORDER — DOCUSATE SODIUM 100 MG PO CAPS: 100.0000 mg | ORAL_CAPSULE | Freq: Two times a day (BID) | ORAL | 0 refills | Status: AC

## 2021-02-04 NOTE — Progress Notes (Signed)
POSTPARTUM PROGRESS NOTE  Post Partum Day 1  Subjective:  Tina Shepard is a 40 y.o. [id number] s/p SVD at [redacted]w[redacted]d.  No acute events overnight.  Pt denies problems with ambulating, voiding or po intake.  She denies nausea or vomiting. Pain is well controlled.  She has had flatus. She has not had bowel movement.  Lochia Small.   Objective: Blood pressure (!) 113/58, pulse 69, temperature 98.2 F (36.8 C), temperature source Oral, resp. rate 16, height 5' (1.524 m), weight 92.1 kg, last menstrual period 04/10/2020, SpO2 97 %, unknown if currently breastfeeding.  Physical Exam:  General: alert, cooperative and no distress Abdomen: soft, nontender  Uterine Fundus: firm, appropriately tender DVT Evaluation: No calf swelling or tenderness Extremities: no edema Skin: warm, dry  Recent Labs    02/02/21 1730  HGB 13.6  HCT 41.6    Assessment/Plan: Tina Shepard is a 40 y.o. [id number] s/p SVD at [redacted]w[redacted]d. Pt had GHTN in labor but has been asymptomatic   PPD#1 - Doing well Contraception: ppIUD Feeding: breast and bottle feeding Dispo: Plan for discharge .   LOS: 2 days   Lona Millard, Medical Student,  02/04/2021, 8:24 AM

## 2021-02-05 NOTE — Discharge Summary (Signed)
Postpartum Discharge Summary    Patient Name: Tina Shepard DOB: [birth year] MRN: [id number]  Date of admission: 02/02/2021 Delivery date:02/03/2021  Delivering provider: Christin Fudge  Date of discharge: 02/05/2021  Admitting diagnosis: Indication for care in labor or delivery [O75.9] Intrauterine pregnancy: [redacted]w[redacted]d    Secondary diagnosis:  Principal Problem:   Vaginal delivery Active Problems:   AMA (advanced maternal age) multigravida 35+   Low-lying placenta   Gestational hypertension  Additional problems: None    Discharge diagnosis: Term Pregnancy Delivered                                              Post partum procedures: PP IUD  Augmentation: AROM and Pitocin Complications: None  Hospital course: Induction of Labor With Vaginal Delivery   40y.o. yo [phone] [id number] admitted to the hospital 02/02/2021 for induction of labor.  Indication for induction:  NRFHT .  Patient had an uncomplicated labor course as follows: Membrane Rupture Time/Date: 8:47 PM ,02/02/2021   Delivery Method:Vaginal, Spontaneous  Episiotomy: None  Lacerations:  None  Details of delivery can be found in separate delivery note.  Patient had a routine postpartum course. She is eating, drinking, voiding, and ambulating without issue. Her pain is controlled and her bleeding is appropriate. Patient is discharged home 02/05/21.  Newborn Data: Birth date:02/03/2021  Birth time:6:42 AM  Gender:Female  Living status:Living  Apgars:8 ,9  Weight:3541 g   Magnesium Sulfate received: No BMZ received: No Rhophylac: N/A MMR: N/A T-DaP: Given prenatally Flu: N/A Transfusion: No  Physical exam  Vitals:   02/04/21 0538 02/04/21 1635 02/04/21 2049 02/05/21 0500  BP: (!) 113/58 111/64 95/63 121/69  Pulse: 69 66 66 74  Resp: _0 Temp: 98.2 F (36.8 C) 98.4 F (36.9 C) 98 F (36.7 C) 98 F (36.7 C)  TempSrc: Oral Oral Oral Oral  SpO2:  100% 100% 99%  Weight:      Height:        General: alert, cooperative, no distress  Lochia: appropriate  Uterine Fundus: firm DVT Evaluation: no LE edema or calf tenderness to palpation  Labs: Lab Results  Component Value Date   WBC 10.9 (H) 02/02/2021   HGB 13.6 02/02/2021   HCT 41.6 02/02/2021   MCV 87.6 02/02/2021   PLT 229 02/02/2021   CMP Latest Ref Rng & Units 02/02/2021  Glucose 70 - 99 mg/dL 94  BUN 6 - 20 mg/dL 9  Creatinine 0.44 - 1.00 mg/dL 0.69  Sodium 135 - 145 mmol/L 134(L)  Potassium 3.5 - 5.1 mmol/L 3.8  Chloride 98 - 111 mmol/L 104  CO2 22 - 32 mmol/L 19(L)  Calcium 8.9 - 10.3 mg/dL 9.0  Total Protein 6.5 - 8.1 g/dL 7.1  Total Bilirubin 0.3 - 1.2 mg/dL 0.4  Alkaline Phos 38 - 126 U/L 151(H)  AST 15 - 41 U/L 22  ALT 0 - 44 U/L 11   Edinburgh Score: Edinburgh Postnatal Depression Scale Screening Tool 02/04/2021  I have been able to laugh and see the funny side of things. 0  I have looked forward with enjoyment to things. 0  I have blamed myself unnecessarily when things went wrong. 0  I have been anxious or worried for no good reason. 0  I have felt scared or panicky for no good reason. 0  Things have  been getting on top of me. 0  I have been so unhappy that I have had difficulty sleeping. 0  I have felt sad or miserable. 0  I have been so unhappy that I have been crying. 0  The thought of harming myself has occurred to me. 0  Edinburgh Postnatal Depression Scale Total 0    After visit meds:  Allergies as of 02/05/2021   No Known Allergies      Medication List     STOP taking these medications    aspirin 81 MG chewable tablet       TAKE these medications    Blood Pressure Monitor Misc For regular home bp monitoring during pregnancy   docusate sodium 100 MG capsule Commonly known as: COLACE Take 1 capsule (100 mg total) by mouth 2 (two) times daily.   ibuprofen 600 MG tablet Commonly known as: ADVIL Take 1 tablet (600 mg total) by mouth every 6 (six) hours.   prenatal  vitamin w/FE, FA 27-1 MG Tabs tablet Take 1 tablet by mouth daily at 12 noon.         Discharge home in stable condition Infant Feeding: Breast Infant Disposition: home with mother Discharge instruction: per After Visit Summary and Postpartum booklet. Activity: Advance as tolerated. Pelvic rest for 6 weeks.  Diet: routine diet Future Appointments: Future Appointments  Date Time Provider [place]  02/10/2021 11:50 AM [facility] NURSE [facility]  03/16/2021 10:30 AM Cresenzo-Dishmon, Joaquim Lai, CNM [facility]   Follow up Visit: Please schedule this patient for a In person postpartum visit in 4 weeks with the following provider: Any provider. Additional Postpartum F/U: BP check 1 week  Low risk pregnancy complicated by: HTN Delivery mode:  Vaginal, Spontaneous  Anticipated Birth Control:  PP IUD placed  02/05/2021 Genia Del, MD

## 2021-02-05 NOTE — Progress Notes (Signed)
[facility] consult received to remove midline. Unit RN contacted via SecureChat informing that midline is a long PIV and anyone trained to remove PIV can remove midline, making sure to use alcohol when removing Statlock and holding pressure slightly longer once line has been pulled. Also requested Bonita Quin, unit RN call [facility] RN with any questions. She verbalized understanding.

## 2021-02-06 ENCOUNTER — Telehealth: Payer: Self-pay

## 2021-02-06 NOTE — Telephone Encounter (Signed)
Transition Care Management Follow-up Telephone Call Date of discharge and from where: 02/05/2021 from [facility] How have you been since you were released from the hospital? Pt stated that she is feeling well and did not have any questions or concerns at this time.  Any questions or concerns? No  Items Reviewed: Did the pt receive and understand the discharge instructions provided? Yes  Medications obtained and verified? Yes  Other? No  Any new allergies since your discharge? No  Dietary orders reviewed? No Do you have support at home? Yes   Functional Questionnaire: (I = Independent and D = Dependent) ADLs: I  Bathing/Dressing- I  Meal Prep- I  Eating- I  Maintaining continence- I  Transferring/Ambulation- I  Managing Meds- I   Follow up appointments reviewed:  PCP Hospital f/u appt confirmed? No  Specialist Hospital f/u appt confirmed? Yes  Scheduled to see [facility] on 02/10/2021 @ 11:50am. Are transportation arrangements needed? No  If their condition worsens, is the pt aware to call PCP or go to the Emergency Dept.? Yes Was the patient provided with contact information for the PCP's office or ED? Yes Was to pt encouraged to call back with questions or concerns? Yes

## 2021-02-10 ENCOUNTER — Telehealth: Payer: Medicaid Other

## 2021-02-16 ENCOUNTER — Telehealth (HOSPITAL_COMMUNITY): Payer: Self-pay | Admitting: *Deleted

## 2021-02-16 NOTE — Telephone Encounter (Signed)
Mom reports feeling good. No concerns about herself. EPDS=0 (hospital score=0) Mom reports baby is well. Feeding well. Peeing and pooping without difficulty. Mom has no concerns about baby.  Duffy Rhody, RN 02-16-2021 at 2:34pm

## 2021-02-25 DIAGNOSIS — Z419 Encounter for procedure for purposes other than remedying health state, unspecified: Secondary | ICD-10-CM | POA: Diagnosis not present

## 2021-03-16 ENCOUNTER — Other Ambulatory Visit: Payer: Self-pay

## 2021-03-16 ENCOUNTER — Encounter: Payer: Self-pay | Admitting: Advanced Practice Midwife

## 2021-03-16 ENCOUNTER — Ambulatory Visit (INDEPENDENT_AMBULATORY_CARE_PROVIDER_SITE_OTHER): Payer: Medicaid Other | Admitting: Advanced Practice Midwife

## 2021-03-16 ENCOUNTER — Ambulatory Visit: Payer: Medicaid Other | Admitting: Advanced Practice Midwife

## 2021-03-16 NOTE — Progress Notes (Signed)
Post Partum Visit Note   Chief Complaint:   Postpartum Care  History of Present Illness:   Tina Shepard is a 40 y.o. [id number] Hispanic female being seen today for a postpartum visit. She is 6 weeks postpartum following a spontaneous vaginal delivery at 39.1 gestational weeks. IOL: Yes, for  NR NST . Anesthesia: epidural.  Laceration: none.  Complications: none. Inpatient contraception: yes Liletta IUD .   Pregnancy uncomplicated. Had transient GHTN in labor, resolved PP Tobacco use: no. Substance use disorder: no. Last pap smear: 2021 and results were NILM w/ HRHPV negative. Next pap smear due: 2024 No LMP recorded. (Menstrual status: IUD).  Postpartum course has been uncomplicated. Bleeding staining only. Bowel function is normal. Bladder function is normal. Urinary incontinence? No, fecal incontinence? No Patient is sexually active. Last sexual activity: 1 week ago.    Upstream - 03/16/21 1026       Pregnancy Intention Screening   Does the patient want to become pregnant in the next year? No    Does the patient's partner want to become pregnant in the next year? No    Would the patient like to discuss contraceptive options today? No      Contraception Wrap Up   Current Method IUD or IUS    End Method IUD or IUS    Contraception Counseling Provided No            The pregnancy intention screening data noted above was reviewed. Potential methods of contraception were discussed. The patient elected to proceed with IUD or IUS.  Edinburgh Postpartum Depression Screening: Negative  Edinburgh Postnatal Depression Scale - 03/16/21 1025       Edinburgh Postnatal Depression Scale:  In the Past 7 Days   I have been able to laugh and see the funny side of things. 0    I have looked forward with enjoyment to things. 0    I have blamed myself unnecessarily when things went wrong. 0    I have been anxious or worried for no good reason. 0    I have felt scared or panicky for no  good reason. 0    Things have been getting on top of me. 0    I have been so unhappy that I have had difficulty sleeping. 0    I have felt sad or miserable. 0    I have been so unhappy that I have been crying. 0    The thought of harming myself has occurred to me. 0    Edinburgh Postnatal Depression Scale Total 0            Baby's course has been uncomplicated. Baby is feeding by bottle. Infant has a pediatrician/family doctor? Yes.  Childcare strategy if returning to work/school: no.  Pt has material needs met for her and baby: Yes.   Review of Systems:   Pertinent items are noted in HPI Denies Abnormal vaginal discharge w/ itching/odor/irritation, headaches, visual changes, shortness of breath, chest pain, abdominal pain, severe nausea/vomiting, or problems with urination or bowel movements. Pertinent History Reviewed:  Reviewed past medical,surgical, obstetrical and family history.  Reviewed problem list, medications and allergies. OB History  Gravida Para Term Preterm AB Living  _0 SAB IAB Ectopic Multiple Live Births        0 4    # Outcome Date GA Lbr Len/2nd Weight Sex Delivery Anes PTL Lv  4 Term 02/03/21 [redacted]w[redacted]d/ 03:29 7  lb 12.9 oz (3.541 kg) F Vag-Spont EPI  LIV  3 Term 01/26/04 [redacted]w[redacted]d 6 lb 5 oz (2.863 kg) F Vag-Spont None N LIV  2 Term 09/13/98 [id number] lb 2 oz (2.778 kg) F  None N LIV  1 Term 05/13/97 4146w0d lb 4 oz (2.381 kg) F Vag-Spont None N LIV   Physical Assessment:   Vitals:   03/16/21 1025  BP: 140/84  Pulse: 71  Weight: 180 lb (81.6 kg)  Body mass index is 35.15 kg/m.  Objective:  Blood pressure 140/84, pulse 71, weight 180 lb (81.6 kg), currently breastfeeding.  General:  alert, cooperative, and no distress   Breasts:  negative  Lungs: Normal respiratory effort  Heart:  regular rate and rhythm  Abdomen: soft, non-tender,    Vulva:  normal  Vagina: not evaluated  Cervix:  normal  Corpus: Well involuted  Adnexa:  not evaluated   Rectal Exam: no hemorrhoids   .       No results found for this or any previous visit (from the past 24 hour(s)).  Assessment & Plan:  1) Postpartum exam 2) 6 wks s/p spontaneous vaginal delivery 3) breast & bottle feeding 4) Depression screening 5) Contraception management: IUD strings trimmed  Essential components of care per ACOG recommendations:  1.  Mood and well being:  If positive depression screen, discussed and plan developed.  If using tobacco we discussed reduction/cessation and risk of relapse If current substance abuse, we discussed and referral to local resources was offered.   2. Infant care and feeding:  If breastfeeding, discussed returning to work, pumping, breastfeeding-associated pain, guidance regarding return to fertility while lactating if not using another method. If needed, patient was provided with a letter to be allowed to pump q 2-3hrs to support lactation in a private location with access to a refrigerator to store breastmilk.   Recommended that all caregivers be immunized for flu, pertussis and other preventable communicable diseases If pt does not have material needs met for her/baby, referred to local resources for help obtaining these.  3. Sexuality, contraception and birth spacing Provided guidance regarding sexuality, management of dyspareunia, and resumption of intercourse Discussed avoiding interpregnancy interval <[id number] recommended birth spacing of 18 months  4. Sleep and fatigue Discussed coping options for fatigue and sleep disruption Encouraged family/partner/community support of 4 hrs of uninterrupted sleep to help with mood and fatigue  5. Physical recovery  If pt had a C/S, assessed incisional pain and providing guidance on normal vs prolonged recovery If pt had a laceration, perineal healing and pain reviewed.  If urinary or fecal incontinence, discussed management and referred to PT or uro/gyn if indicated  Patient is safe to  resume physical activity. Discussed attainment of healthy weight.  6.  Chronic disease management Discussed pregnancy complications if any, and their implications for future childbearing and long-term maternal health. Review recommendations for prevention of recurrent pregnancy complications, such as 17 hydroxyprogesterone caproate to reduce risk for recurrent PTB not applicable, or aspirin to reduce risk of preeclampsia yes. Pt had GDM: No. If yes, 2hr GTT scheduled: not applicable. Reviewed medications and non-pregnant dosing including consideration of whether pt is breastfeeding using a reliable resource such as LactMed: not applicable Referred for f/u w/ PCP or subspecialist providers as indicated: not applicable  7. Health maintenance Mammogram at [id number] if indicated Pap smears as indicated  Meds: No orders of the defined types were placed in this encounter.  Follow-up: Return for If you have any problems.   No orders of the defined types were placed in this encounter.      Bell Buckle for [employer], [facility] 03/16/2021 7:08 PM

## 2021-03-28 DIAGNOSIS — Z419 Encounter for procedure for purposes other than remedying health state, unspecified: Secondary | ICD-10-CM | POA: Diagnosis not present

## 2021-04-27 DIAGNOSIS — Z419 Encounter for procedure for purposes other than remedying health state, unspecified: Secondary | ICD-10-CM | POA: Diagnosis not present

## 2021-05-28 DIAGNOSIS — Z419 Encounter for procedure for purposes other than remedying health state, unspecified: Secondary | ICD-10-CM | POA: Diagnosis not present

## 2021-06-28 DIAGNOSIS — Z419 Encounter for procedure for purposes other than remedying health state, unspecified: Secondary | ICD-10-CM | POA: Diagnosis not present

## 2021-07-26 DIAGNOSIS — Z419 Encounter for procedure for purposes other than remedying health state, unspecified: Secondary | ICD-10-CM | POA: Diagnosis not present

## 2021-08-26 DIAGNOSIS — Z419 Encounter for procedure for purposes other than remedying health state, unspecified: Secondary | ICD-10-CM | POA: Diagnosis not present

## 2021-09-25 DIAGNOSIS — Z419 Encounter for procedure for purposes other than remedying health state, unspecified: Secondary | ICD-10-CM | POA: Diagnosis not present

## 2021-10-26 DIAGNOSIS — Z419 Encounter for procedure for purposes other than remedying health state, unspecified: Secondary | ICD-10-CM | POA: Diagnosis not present

## 2021-11-25 DIAGNOSIS — Z419 Encounter for procedure for purposes other than remedying health state, unspecified: Secondary | ICD-10-CM | POA: Diagnosis not present

## 2021-12-26 DIAGNOSIS — Z419 Encounter for procedure for purposes other than remedying health state, unspecified: Secondary | ICD-10-CM | POA: Diagnosis not present

## 2022-01-16 DIAGNOSIS — M79674 Pain in right toe(s): Secondary | ICD-10-CM | POA: Diagnosis not present

## 2022-01-26 DIAGNOSIS — Z419 Encounter for procedure for purposes other than remedying health state, unspecified: Secondary | ICD-10-CM | POA: Diagnosis not present

## 2022-05-28 DIAGNOSIS — Z419 Encounter for procedure for purposes other than remedying health state, unspecified: Secondary | ICD-10-CM | POA: Diagnosis not present

## 2022-06-28 DIAGNOSIS — Z419 Encounter for procedure for purposes other than remedying health state, unspecified: Secondary | ICD-10-CM | POA: Diagnosis not present

## 2022-07-27 DIAGNOSIS — Z419 Encounter for procedure for purposes other than remedying health state, unspecified: Secondary | ICD-10-CM | POA: Diagnosis not present

## 2022-08-27 DIAGNOSIS — Z419 Encounter for procedure for purposes other than remedying health state, unspecified: Secondary | ICD-10-CM | POA: Diagnosis not present

## 2022-09-26 DIAGNOSIS — Z419 Encounter for procedure for purposes other than remedying health state, unspecified: Secondary | ICD-10-CM | POA: Diagnosis not present

## 2022-10-27 DIAGNOSIS — Z419 Encounter for procedure for purposes other than remedying health state, unspecified: Secondary | ICD-10-CM | POA: Diagnosis not present

## 2022-11-26 DIAGNOSIS — Z419 Encounter for procedure for purposes other than remedying health state, unspecified: Secondary | ICD-10-CM | POA: Diagnosis not present

## 2022-12-27 DIAGNOSIS — Z419 Encounter for procedure for purposes other than remedying health state, unspecified: Secondary | ICD-10-CM | POA: Diagnosis not present

## 2023-01-27 DIAGNOSIS — Z419 Encounter for procedure for purposes other than remedying health state, unspecified: Secondary | ICD-10-CM | POA: Diagnosis not present

## 2023-02-26 DIAGNOSIS — Z419 Encounter for procedure for purposes other than remedying health state, unspecified: Secondary | ICD-10-CM | POA: Diagnosis not present

## 2023-03-29 DIAGNOSIS — Z419 Encounter for procedure for purposes other than remedying health state, unspecified: Secondary | ICD-10-CM | POA: Diagnosis not present

## 2023-04-03 DIAGNOSIS — Z6838 Body mass index (BMI) 38.0-38.9, adult: Secondary | ICD-10-CM | POA: Diagnosis not present

## 2023-04-03 DIAGNOSIS — E782 Mixed hyperlipidemia: Secondary | ICD-10-CM | POA: Diagnosis not present

## 2023-04-03 DIAGNOSIS — E66812 Obesity, class 2: Secondary | ICD-10-CM | POA: Diagnosis not present

## 2023-04-03 DIAGNOSIS — R7301 Impaired fasting glucose: Secondary | ICD-10-CM | POA: Diagnosis not present

## 2023-04-03 DIAGNOSIS — I10 Essential (primary) hypertension: Secondary | ICD-10-CM | POA: Diagnosis not present

## 2023-04-28 DIAGNOSIS — Z419 Encounter for procedure for purposes other than remedying health state, unspecified: Secondary | ICD-10-CM | POA: Diagnosis not present

## 2023-05-29 DIAGNOSIS — Z419 Encounter for procedure for purposes other than remedying health state, unspecified: Secondary | ICD-10-CM | POA: Diagnosis not present

## 2023-06-29 DIAGNOSIS — Z419 Encounter for procedure for purposes other than remedying health state, unspecified: Secondary | ICD-10-CM | POA: Diagnosis not present

## 2023-07-27 DIAGNOSIS — Z419 Encounter for procedure for purposes other than remedying health state, unspecified: Secondary | ICD-10-CM | POA: Diagnosis not present

## 2023-08-27 DIAGNOSIS — E66812 Obesity, class 2: Secondary | ICD-10-CM | POA: Diagnosis not present

## 2023-08-27 DIAGNOSIS — Z1231 Encounter for screening mammogram for malignant neoplasm of breast: Secondary | ICD-10-CM | POA: Diagnosis not present

## 2023-08-27 DIAGNOSIS — Z6838 Body mass index (BMI) 38.0-38.9, adult: Secondary | ICD-10-CM | POA: Diagnosis not present

## 2023-09-07 DIAGNOSIS — Z419 Encounter for procedure for purposes other than remedying health state, unspecified: Secondary | ICD-10-CM | POA: Diagnosis not present

## 2023-10-07 DIAGNOSIS — Z419 Encounter for procedure for purposes other than remedying health state, unspecified: Secondary | ICD-10-CM | POA: Diagnosis not present

## 2023-11-07 DIAGNOSIS — Z419 Encounter for procedure for purposes other than remedying health state, unspecified: Secondary | ICD-10-CM | POA: Diagnosis not present

## 2023-12-07 DIAGNOSIS — Z419 Encounter for procedure for purposes other than remedying health state, unspecified: Secondary | ICD-10-CM | POA: Diagnosis not present

## 2024-01-07 DIAGNOSIS — Z419 Encounter for procedure for purposes other than remedying health state, unspecified: Secondary | ICD-10-CM | POA: Diagnosis not present

## 2024-02-07 DIAGNOSIS — Z419 Encounter for procedure for purposes other than remedying health state, unspecified: Secondary | ICD-10-CM | POA: Diagnosis not present

## 2024-03-08 DIAGNOSIS — Z419 Encounter for procedure for purposes other than remedying health state, unspecified: Secondary | ICD-10-CM | POA: Diagnosis not present

## 2024-03-10 ENCOUNTER — Ambulatory Visit: Admitting: Obstetrics and Gynecology

## 2024-04-08 DIAGNOSIS — Z419 Encounter for procedure for purposes other than remedying health state, unspecified: Secondary | ICD-10-CM | POA: Diagnosis not present
